# Patient Record
Sex: Female | Born: 1975 | Race: White | Hispanic: No | Marital: Married | Smoking: Current every day smoker
Health system: Southern US, Community
[De-identification: ages and names within clinical notes are randomized; demographics above are authoritative.]

## PROBLEM LIST (undated history)

## (undated) DIAGNOSIS — E119 Type 2 diabetes mellitus without complications: Secondary | ICD-10-CM

## (undated) DIAGNOSIS — E785 Hyperlipidemia, unspecified: Secondary | ICD-10-CM

## (undated) DIAGNOSIS — D649 Anemia, unspecified: Secondary | ICD-10-CM

## (undated) DIAGNOSIS — N189 Chronic kidney disease, unspecified: Secondary | ICD-10-CM

## (undated) DIAGNOSIS — F329 Major depressive disorder, single episode, unspecified: Secondary | ICD-10-CM

## (undated) DIAGNOSIS — F319 Bipolar disorder, unspecified: Secondary | ICD-10-CM

## (undated) DIAGNOSIS — F603 Borderline personality disorder: Secondary | ICD-10-CM

## (undated) DIAGNOSIS — J45909 Unspecified asthma, uncomplicated: Secondary | ICD-10-CM

## (undated) DIAGNOSIS — F4310 Post-traumatic stress disorder, unspecified: Secondary | ICD-10-CM

## (undated) DIAGNOSIS — F419 Anxiety disorder, unspecified: Secondary | ICD-10-CM

## (undated) DIAGNOSIS — E079 Disorder of thyroid, unspecified: Secondary | ICD-10-CM

## (undated) HISTORY — DX: Unspecified asthma, uncomplicated: J45.909

## (undated) HISTORY — DX: Chronic kidney disease, unspecified: N18.9

## (undated) HISTORY — DX: Disorder of thyroid, unspecified: E07.9

## (undated) HISTORY — PX: OTHER SURGICAL HISTORY: SHX169

## (undated) HISTORY — DX: Anemia, unspecified: D64.9

## (undated) HISTORY — DX: Hyperlipidemia, unspecified: E78.5

## (undated) HISTORY — PX: TUBAL LIGATION: SHX77

---

## 1999-02-27 ENCOUNTER — Encounter: Payer: Self-pay | Admitting: Obstetrics and Gynecology

## 1999-02-27 ENCOUNTER — Ambulatory Visit (HOSPITAL_COMMUNITY): Admission: RE | Admit: 1999-02-27 | Discharge: 1999-02-27 | Payer: Self-pay | Admitting: Obstetrics and Gynecology

## 1999-06-10 ENCOUNTER — Ambulatory Visit (HOSPITAL_COMMUNITY): Admission: RE | Admit: 1999-06-10 | Discharge: 1999-06-10 | Payer: Self-pay | Admitting: Obstetrics and Gynecology

## 1999-06-10 ENCOUNTER — Encounter: Payer: Self-pay | Admitting: Obstetrics and Gynecology

## 1999-07-13 ENCOUNTER — Inpatient Hospital Stay (HOSPITAL_COMMUNITY): Admission: AD | Admit: 1999-07-13 | Discharge: 1999-07-13 | Payer: Self-pay | Admitting: Obstetrics and Gynecology

## 1999-07-19 ENCOUNTER — Inpatient Hospital Stay (HOSPITAL_COMMUNITY): Admission: AD | Admit: 1999-07-19 | Discharge: 1999-07-19 | Payer: Self-pay | Admitting: Obstetrics and Gynecology

## 1999-07-21 ENCOUNTER — Inpatient Hospital Stay (HOSPITAL_COMMUNITY): Admission: AD | Admit: 1999-07-21 | Discharge: 1999-07-21 | Payer: Self-pay | Admitting: Obstetrics and Gynecology

## 1999-07-24 ENCOUNTER — Inpatient Hospital Stay (HOSPITAL_COMMUNITY): Admission: AD | Admit: 1999-07-24 | Discharge: 1999-07-24 | Payer: Self-pay | Admitting: Obstetrics and Gynecology

## 1999-07-25 ENCOUNTER — Inpatient Hospital Stay (HOSPITAL_COMMUNITY): Admission: AD | Admit: 1999-07-25 | Discharge: 1999-07-27 | Payer: Self-pay | Admitting: Obstetrics and Gynecology

## 1999-07-30 ENCOUNTER — Inpatient Hospital Stay (HOSPITAL_COMMUNITY): Admission: AD | Admit: 1999-07-30 | Discharge: 1999-07-31 | Payer: Self-pay | Admitting: Obstetrics and Gynecology

## 1999-07-30 ENCOUNTER — Encounter: Payer: Self-pay | Admitting: Obstetrics and Gynecology

## 1999-07-31 ENCOUNTER — Encounter: Payer: Self-pay | Admitting: Pulmonary Disease

## 1999-09-03 ENCOUNTER — Other Ambulatory Visit: Admission: RE | Admit: 1999-09-03 | Discharge: 1999-09-03 | Payer: Self-pay | Admitting: Obstetrics and Gynecology

## 1999-10-10 ENCOUNTER — Other Ambulatory Visit: Admission: RE | Admit: 1999-10-10 | Discharge: 1999-10-10 | Payer: Self-pay | Admitting: Obstetrics and Gynecology

## 2000-02-05 ENCOUNTER — Other Ambulatory Visit: Admission: RE | Admit: 2000-02-05 | Discharge: 2000-02-05 | Payer: Self-pay | Admitting: Obstetrics and Gynecology

## 2000-12-16 ENCOUNTER — Other Ambulatory Visit: Admission: RE | Admit: 2000-12-16 | Discharge: 2000-12-16 | Payer: Self-pay | Admitting: Obstetrics and Gynecology

## 2001-01-04 ENCOUNTER — Emergency Department (HOSPITAL_COMMUNITY): Admission: EM | Admit: 2001-01-04 | Discharge: 2001-01-04 | Payer: Self-pay | Admitting: Emergency Medicine

## 2001-09-18 ENCOUNTER — Emergency Department (HOSPITAL_COMMUNITY): Admission: EM | Admit: 2001-09-18 | Discharge: 2001-09-18 | Payer: Self-pay | Admitting: *Deleted

## 2001-09-27 ENCOUNTER — Emergency Department (HOSPITAL_COMMUNITY): Admission: EM | Admit: 2001-09-27 | Discharge: 2001-09-27 | Payer: Self-pay

## 2001-11-29 ENCOUNTER — Emergency Department (HOSPITAL_COMMUNITY): Admission: EM | Admit: 2001-11-29 | Discharge: 2001-11-29 | Payer: Self-pay | Admitting: Emergency Medicine

## 2002-03-15 ENCOUNTER — Emergency Department (HOSPITAL_COMMUNITY): Admission: EM | Admit: 2002-03-15 | Discharge: 2002-03-15 | Payer: Self-pay | Admitting: Emergency Medicine

## 2002-04-27 ENCOUNTER — Emergency Department (HOSPITAL_COMMUNITY): Admission: EM | Admit: 2002-04-27 | Discharge: 2002-04-28 | Payer: Self-pay | Admitting: Emergency Medicine

## 2004-02-12 ENCOUNTER — Ambulatory Visit: Payer: Self-pay | Admitting: Family Medicine

## 2004-02-23 ENCOUNTER — Ambulatory Visit: Payer: Self-pay | Admitting: *Deleted

## 2004-03-07 ENCOUNTER — Ambulatory Visit: Payer: Self-pay | Admitting: Family Medicine

## 2004-03-29 ENCOUNTER — Ambulatory Visit (HOSPITAL_COMMUNITY): Admission: RE | Admit: 2004-03-29 | Discharge: 2004-03-29 | Payer: Self-pay | Admitting: *Deleted

## 2004-04-04 ENCOUNTER — Ambulatory Visit: Payer: Self-pay | Admitting: Family Medicine

## 2004-04-05 ENCOUNTER — Ambulatory Visit: Payer: Self-pay | Admitting: Family Medicine

## 2004-04-08 ENCOUNTER — Encounter: Admission: RE | Admit: 2004-04-08 | Discharge: 2004-04-08 | Payer: Self-pay | Admitting: *Deleted

## 2004-04-18 ENCOUNTER — Ambulatory Visit: Payer: Self-pay | Admitting: Family Medicine

## 2004-04-25 ENCOUNTER — Ambulatory Visit: Payer: Self-pay | Admitting: Family Medicine

## 2004-05-09 ENCOUNTER — Ambulatory Visit: Payer: Self-pay | Admitting: Family Medicine

## 2004-05-23 ENCOUNTER — Ambulatory Visit: Payer: Self-pay | Admitting: Family Medicine

## 2004-06-04 ENCOUNTER — Ambulatory Visit (HOSPITAL_COMMUNITY): Admission: RE | Admit: 2004-06-04 | Discharge: 2004-06-04 | Payer: Self-pay | Admitting: *Deleted

## 2004-06-06 ENCOUNTER — Ambulatory Visit: Payer: Self-pay | Admitting: Family Medicine

## 2004-06-20 ENCOUNTER — Ambulatory Visit: Payer: Self-pay | Admitting: Family Medicine

## 2004-06-27 ENCOUNTER — Ambulatory Visit: Payer: Self-pay | Admitting: Family Medicine

## 2004-07-03 ENCOUNTER — Ambulatory Visit: Payer: Self-pay | Admitting: *Deleted

## 2004-07-10 ENCOUNTER — Ambulatory Visit: Payer: Self-pay | Admitting: *Deleted

## 2004-07-17 ENCOUNTER — Ambulatory Visit: Payer: Self-pay | Admitting: *Deleted

## 2004-07-24 ENCOUNTER — Ambulatory Visit: Payer: Self-pay | Admitting: *Deleted

## 2004-07-31 ENCOUNTER — Ambulatory Visit: Payer: Self-pay | Admitting: *Deleted

## 2004-08-02 ENCOUNTER — Ambulatory Visit (HOSPITAL_COMMUNITY): Admission: RE | Admit: 2004-08-02 | Discharge: 2004-08-02 | Payer: Self-pay | Admitting: *Deleted

## 2004-08-07 ENCOUNTER — Ambulatory Visit: Payer: Self-pay | Admitting: *Deleted

## 2004-08-14 ENCOUNTER — Ambulatory Visit: Payer: Self-pay | Admitting: *Deleted

## 2004-08-21 ENCOUNTER — Ambulatory Visit: Payer: Self-pay | Admitting: Obstetrics & Gynecology

## 2004-08-26 ENCOUNTER — Ambulatory Visit (HOSPITAL_COMMUNITY): Admission: RE | Admit: 2004-08-26 | Discharge: 2004-08-26 | Payer: Self-pay | Admitting: *Deleted

## 2004-08-26 ENCOUNTER — Ambulatory Visit: Payer: Self-pay | Admitting: Obstetrics & Gynecology

## 2004-08-28 ENCOUNTER — Ambulatory Visit: Payer: Self-pay | Admitting: *Deleted

## 2004-09-02 ENCOUNTER — Ambulatory Visit: Payer: Self-pay | Admitting: Obstetrics and Gynecology

## 2004-09-02 ENCOUNTER — Ambulatory Visit (HOSPITAL_COMMUNITY): Admission: RE | Admit: 2004-09-02 | Discharge: 2004-09-02 | Payer: Self-pay | Admitting: *Deleted

## 2004-09-04 ENCOUNTER — Ambulatory Visit: Payer: Self-pay | Admitting: Family Medicine

## 2004-09-09 ENCOUNTER — Ambulatory Visit: Payer: Self-pay | Admitting: Obstetrics and Gynecology

## 2004-09-09 ENCOUNTER — Ambulatory Visit (HOSPITAL_COMMUNITY): Admission: RE | Admit: 2004-09-09 | Discharge: 2004-09-09 | Payer: Self-pay | Admitting: *Deleted

## 2004-09-11 ENCOUNTER — Ambulatory Visit: Payer: Self-pay | Admitting: *Deleted

## 2004-09-16 ENCOUNTER — Inpatient Hospital Stay (HOSPITAL_COMMUNITY): Admission: AD | Admit: 2004-09-16 | Discharge: 2004-09-20 | Payer: Self-pay | Admitting: *Deleted

## 2004-09-16 ENCOUNTER — Ambulatory Visit: Payer: Self-pay | Admitting: Obstetrics & Gynecology

## 2004-09-16 ENCOUNTER — Ambulatory Visit: Payer: Self-pay | Admitting: Obstetrics and Gynecology

## 2004-09-16 ENCOUNTER — Ambulatory Visit: Payer: Self-pay | Admitting: Certified Nurse Midwife

## 2004-09-16 ENCOUNTER — Encounter: Payer: Self-pay | Admitting: *Deleted

## 2004-10-03 ENCOUNTER — Inpatient Hospital Stay (HOSPITAL_COMMUNITY): Admission: AD | Admit: 2004-10-03 | Discharge: 2004-10-04 | Payer: Self-pay | Admitting: Obstetrics & Gynecology

## 2004-11-18 ENCOUNTER — Ambulatory Visit: Payer: Self-pay | Admitting: Family Medicine

## 2005-01-13 ENCOUNTER — Ambulatory Visit: Payer: Self-pay | Admitting: Family Medicine

## 2005-09-19 IMAGING — US US OB FOLLOW-UP
1 series · 18 of 28 positions shown · non-contrast
Comparison: none

CLINICAL DATA: 30 week 2 day assigned gestational age.  Insulin dependent diabetes.  Evaluate fetal growth and biophysical profile.

[Series 1: us ob re-eval · 18 of 34 slices shown]
[im 1/34]
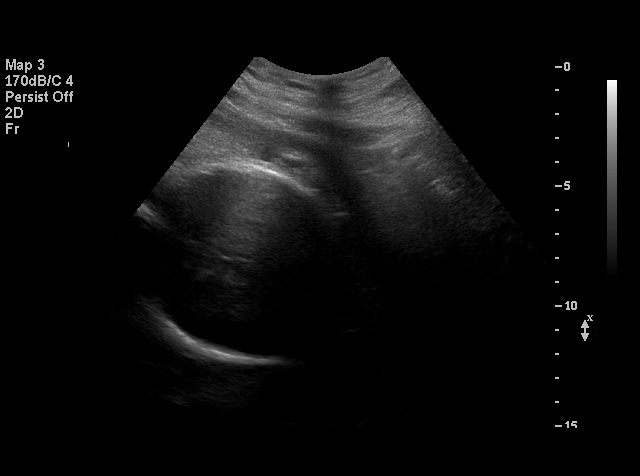
[im 3/34]
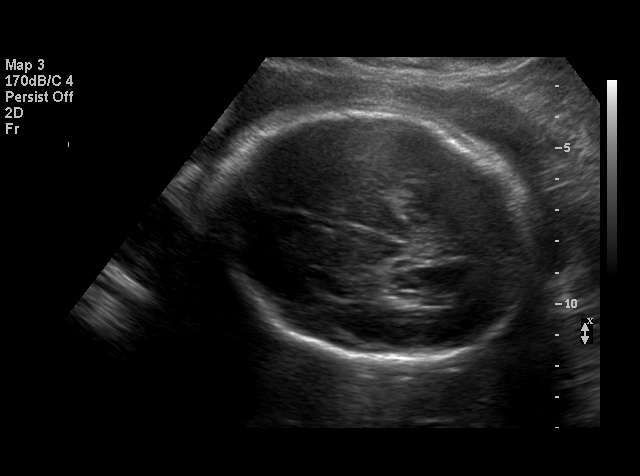
[im 4/34]
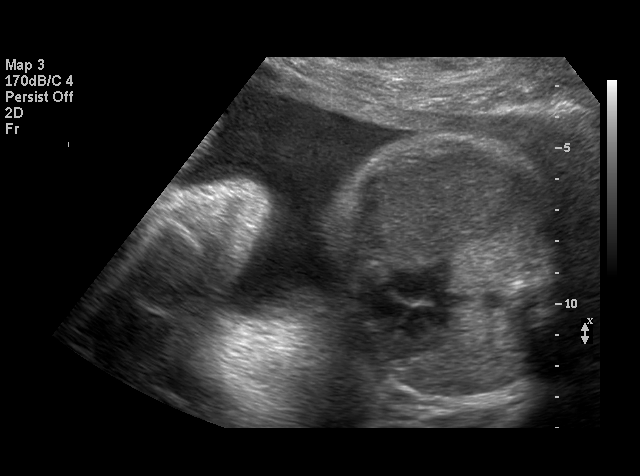
[im 7/34]
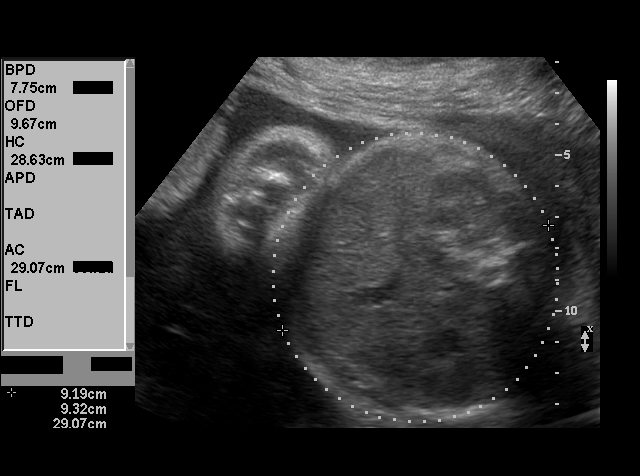
[im 9/34]
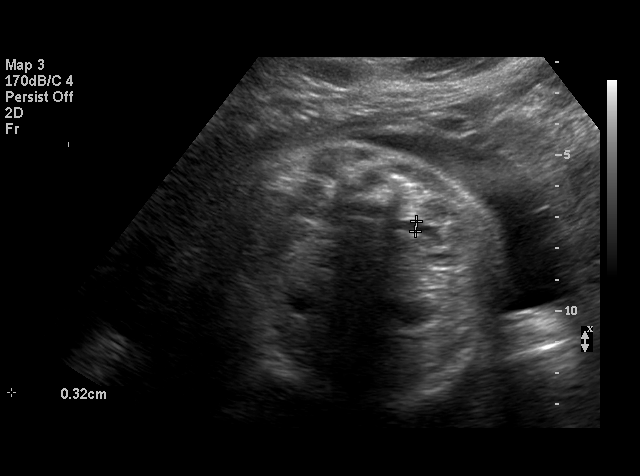
[im 10/34]
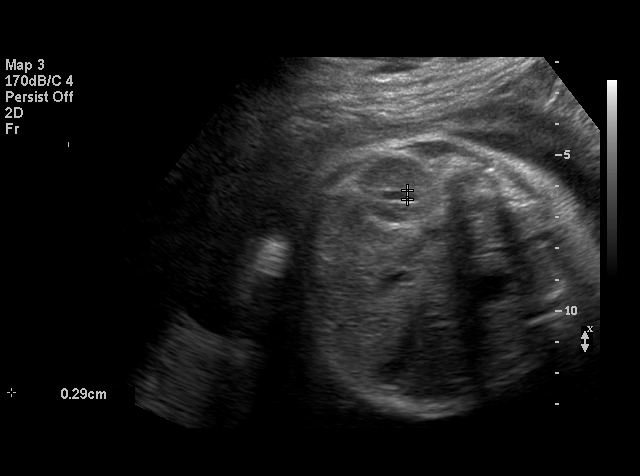
[im 13/34]
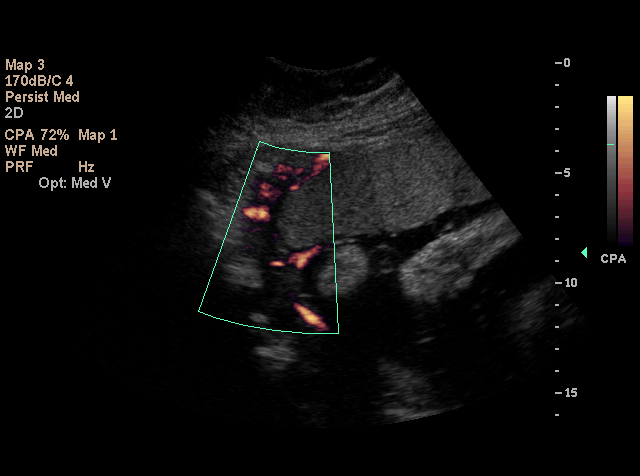
[im 14/34]
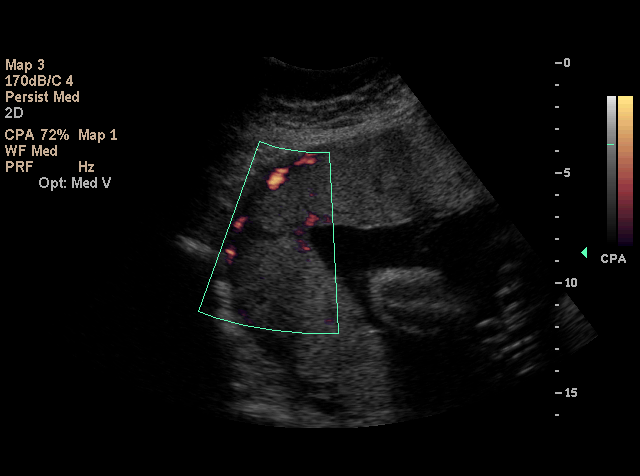
[im 16/34]
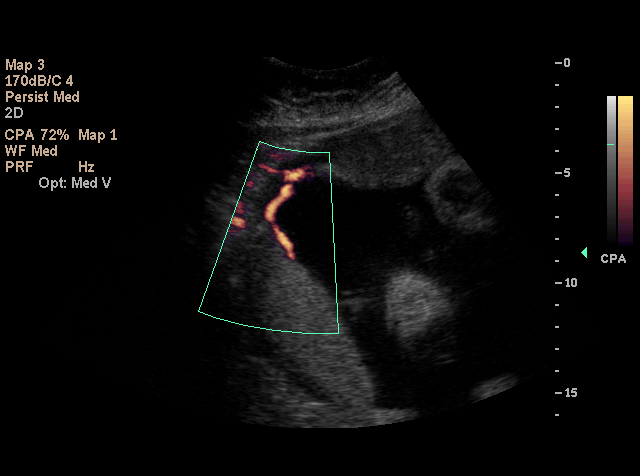
[im 18/34]
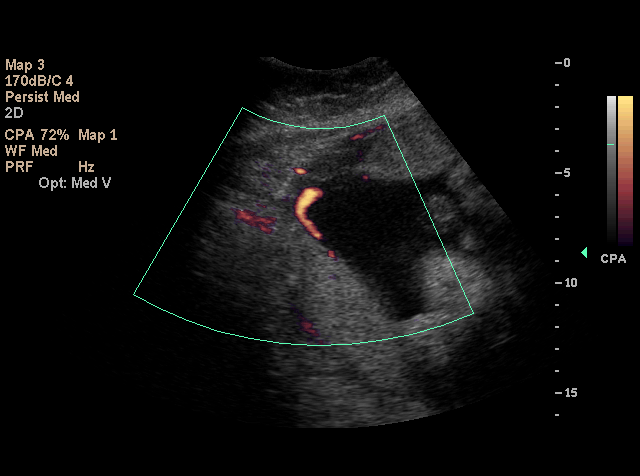
[im 20/34]
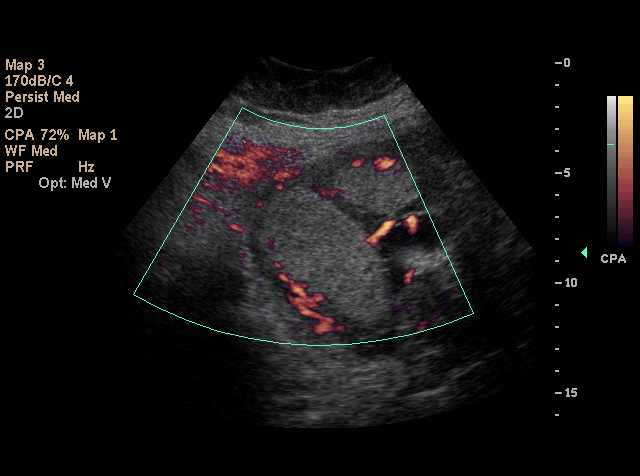
[im 21/34]
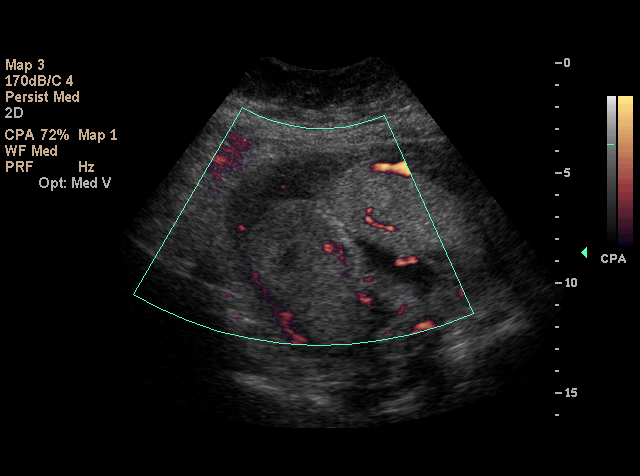
[im 24/34]
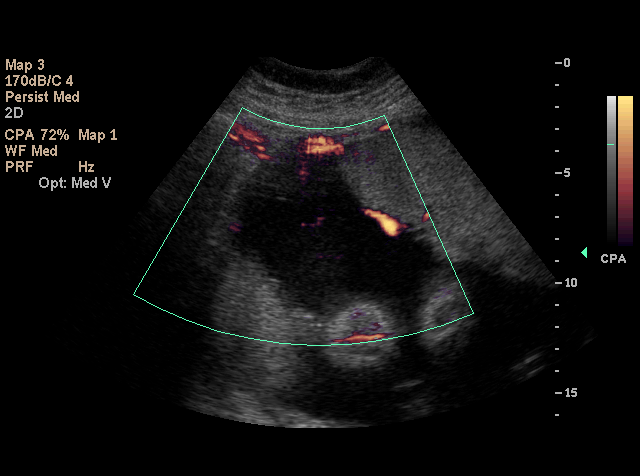
[im 26/34]
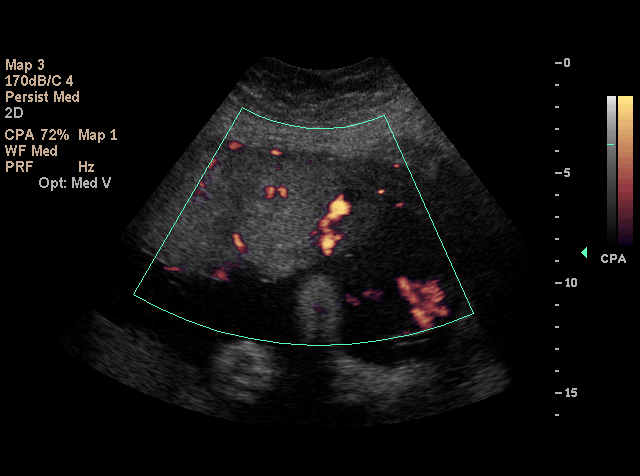
[im 27/34]
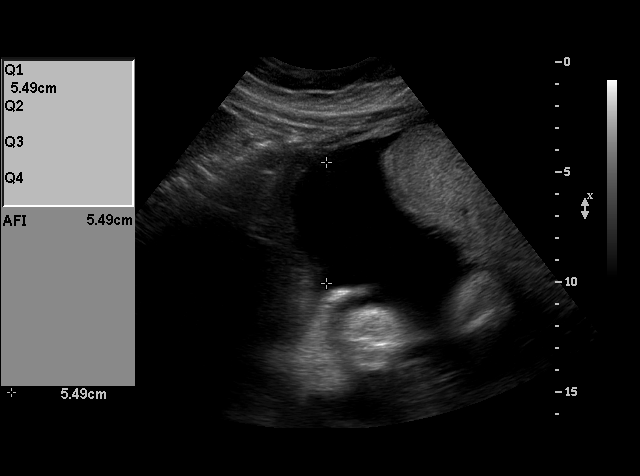
[im 30/34]
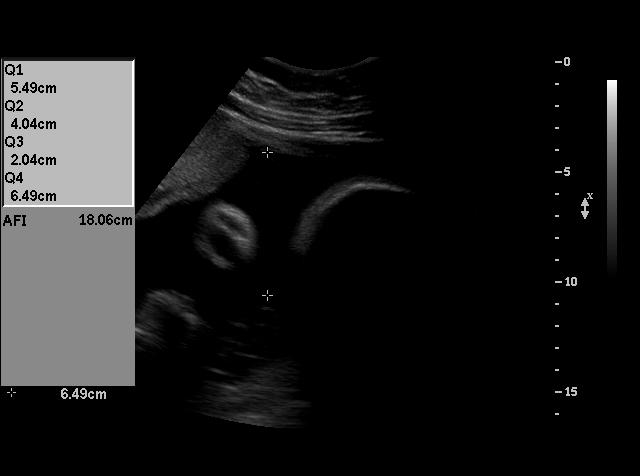
[im 31/34]
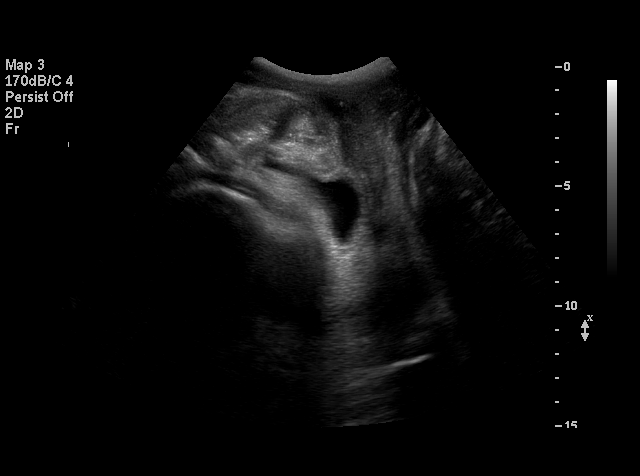
[im 34/34]
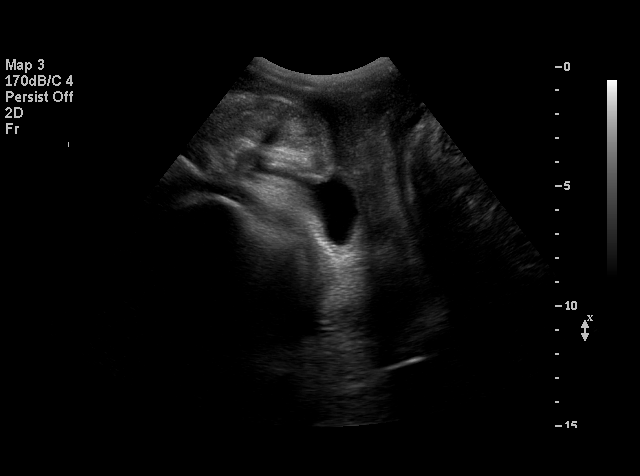

[18 of 28 positions shown; findings below may reference images not displayed]

OBSTETRICAL ULTRASOUND RE-EVALUATION:
 Number of Fetuses:  1
 Heart Rate:  158
 Movement:  Yes
 Breathing:    Yes
 Presentation:  Cephalic
 Placental Location:  Anterior, right lateral 
 Grade:  I
 Previa:  No
 Amniotic Fluid (subjective):  Normal
 Amniotic Fluid (objective):  18.1 cm AFI (5th -95th%ile = 9.0 – 23.4 cm for 30 wks)

 FETAL BIOMETRY
 BPD:  7.7 cm  31 w 1 d
 HC:  28.5 cm  31 w 3 d
 AC:  29.2 cm  33 w 2 d
 FL:  5.8 cm   30 w 2 d

 Mean GA:  31 w 4 d
 Assigned GA:  30 w 2 d

 EFW:  7835 g (H) Greater than 95th%ile (95th = 5646 g) For 30 wks

 FETAL ANATOMY
 Lateral Ventricles:  Visualized 
 Thalami/CSP:  Previously seen 
 Posterior Fossa:  Previously seen 
 Nuchal Region:  N/A
 Spine:  Previously seen 
 4 Chamber Heart on Left:  Visualized 
 Stomach on Left:  Visualized 
 3 Vessel Cord:  Previously seen 
 Cord Insertion Site:  Previously seen 
 Kidneys:  Visualized 
 Bladder:  Visualized 
 Extremities:  Previously seen 

 MATERNAL UTERINE AND ADNEXAL FINDINGS
 Cervix:  4.2 cm Translabially
IMPRESSION: 1.  Assigned gestational age is currently 30 weeks 2 days.  AC is 3 weeks ahead, and EFW greater than 95th percentile.  This is suspicious for fetal macrosomia.  
 2.  Normal amniotic fluid volume with AFI of 18.1 cm.  
 BIOPHYSICAL PROFILE

 Movement:  2    Time:  20 minutes
 Breathing:  2
 Tone:  2
 Amniotic Fluid:  2

 Total Score:  8
IMPRESSION: Biophysical profile score [DATE].

## 2005-10-13 IMAGING — US US FETAL BPP W/O NONSTRESS
1 series · 18 of 23 positions shown · non-contrast
Comparison: none

CLINICAL DATA: Insulin dependent diabetic.  Assess fetal well-being.

[Series 1: us fetal bpp w/o nonstress · non-contrast · 18 of 23 slices shown]
[im 1/23]
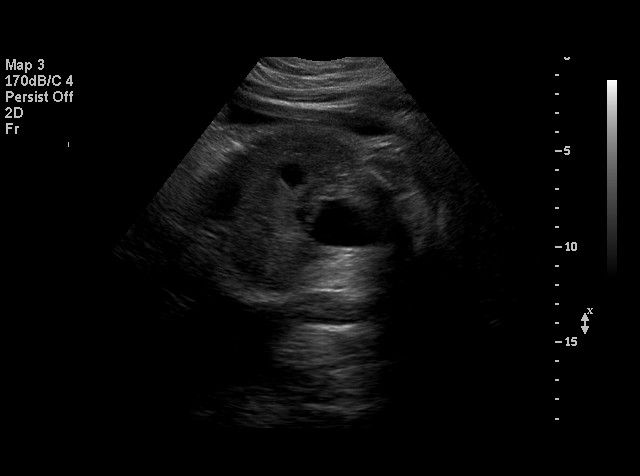
[im 2/23]
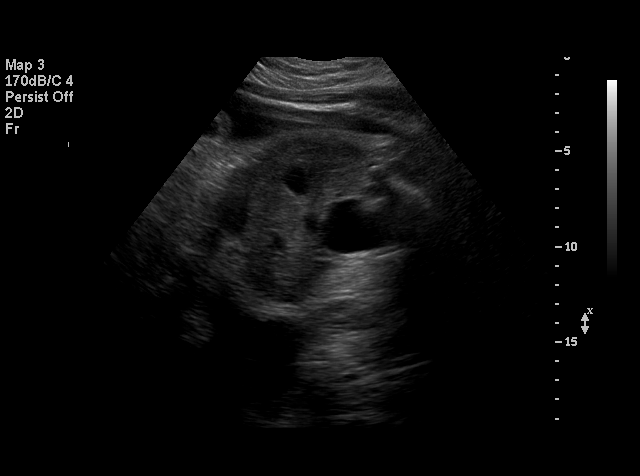
[im 4/23]
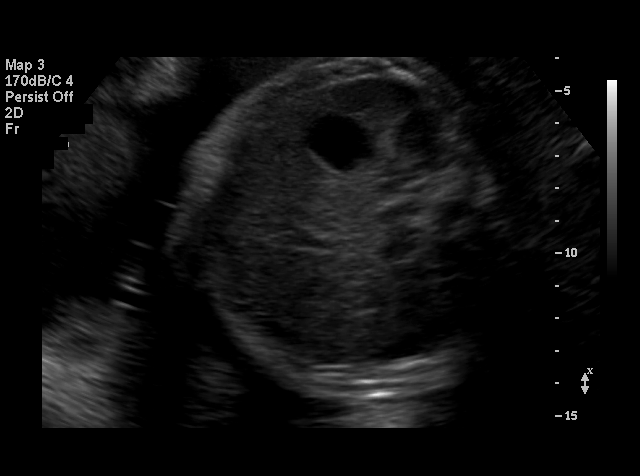
[im 5/23]
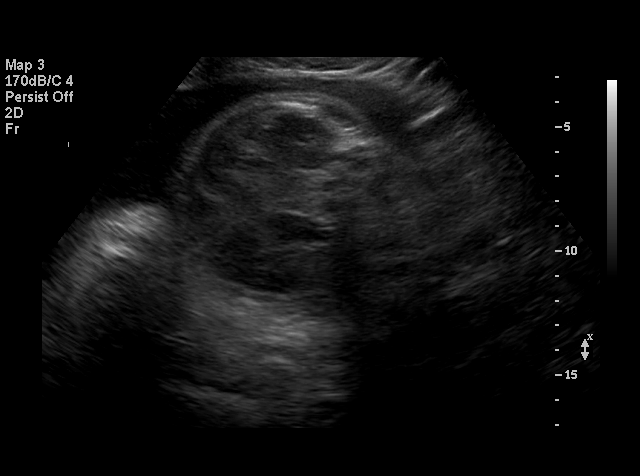
[im 6/23]
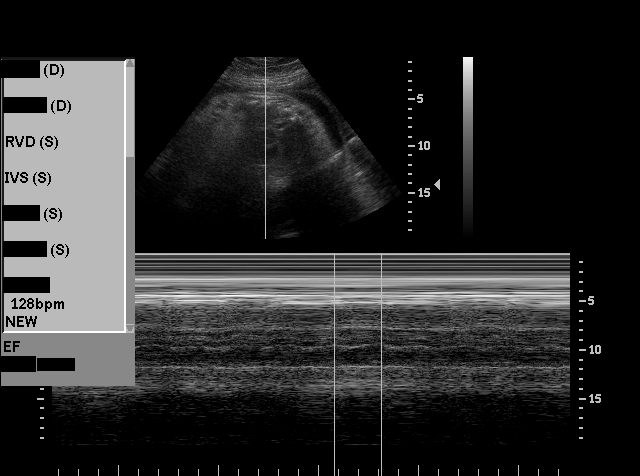
[im 8/23]
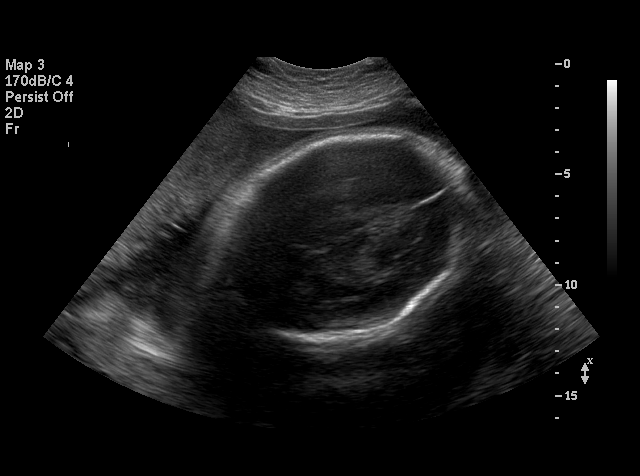
[im 9/23]
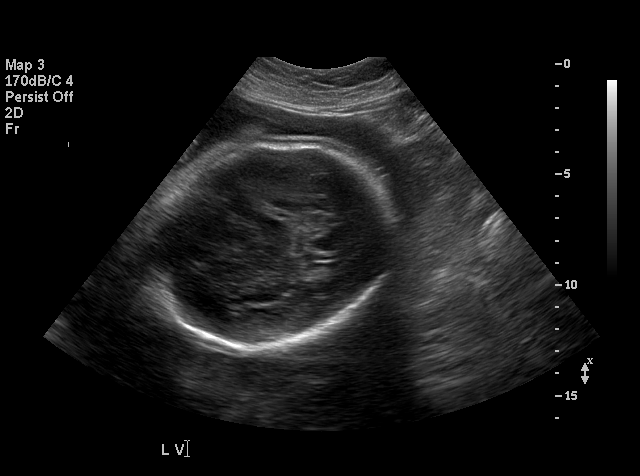
[im 10/23]
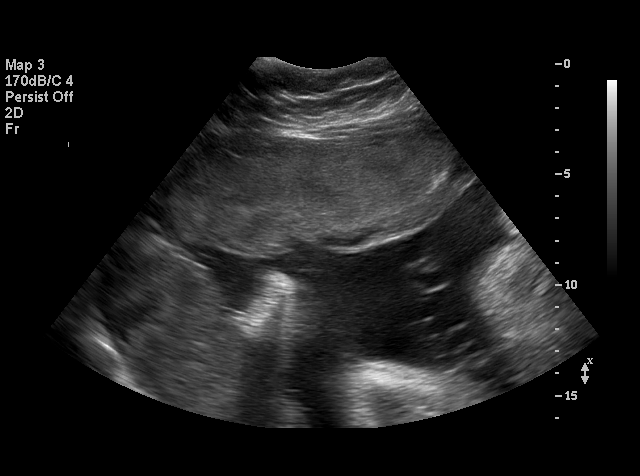
[im 11/23]
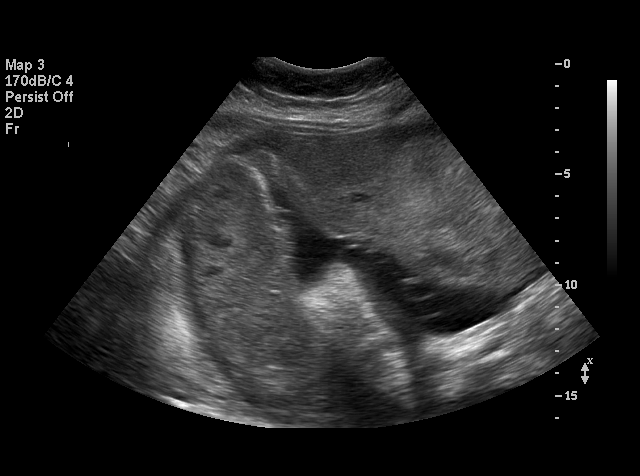
[im 13/23]
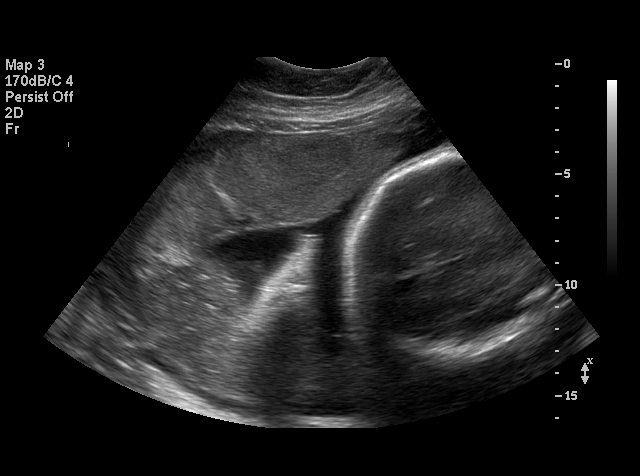
[im 14/23]
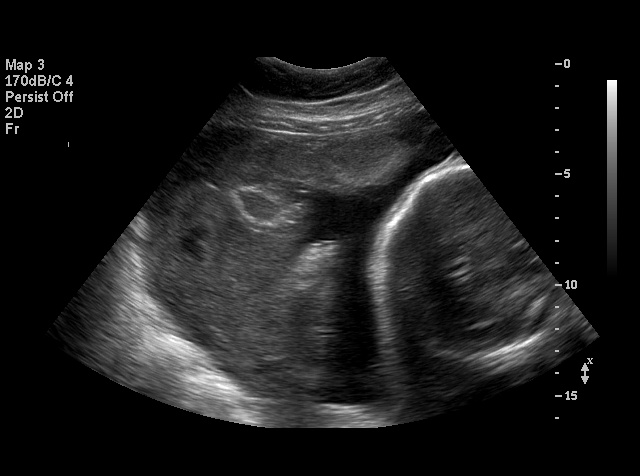
[im 15/23]
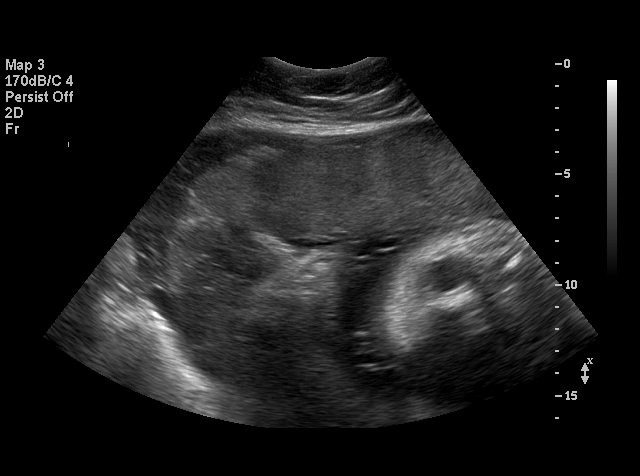
[im 16/23]
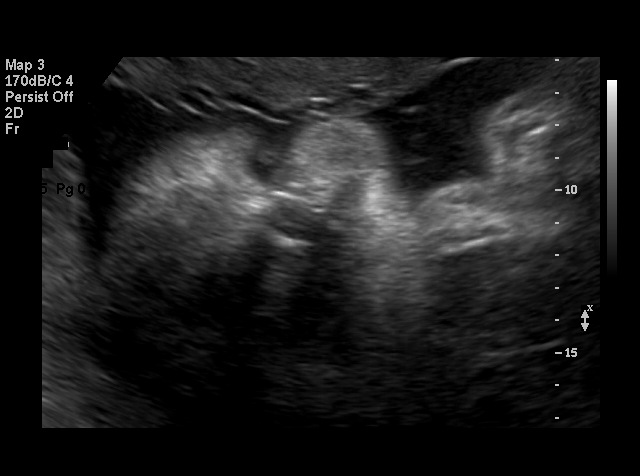
[im 18/23]
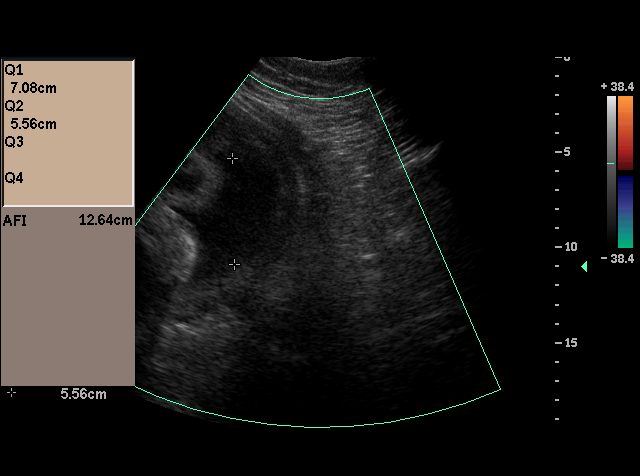
[im 19/23]
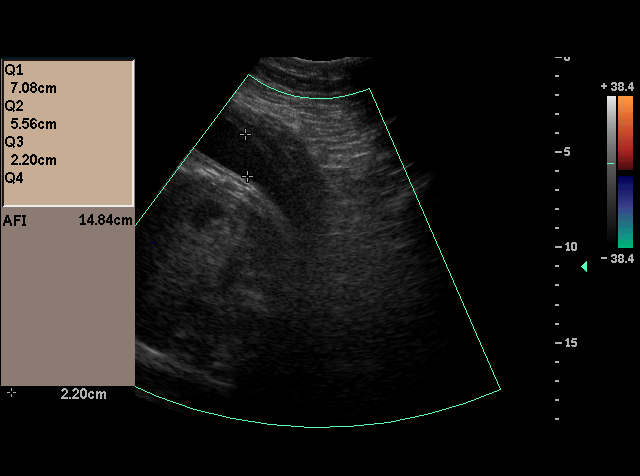
[im 20/23]
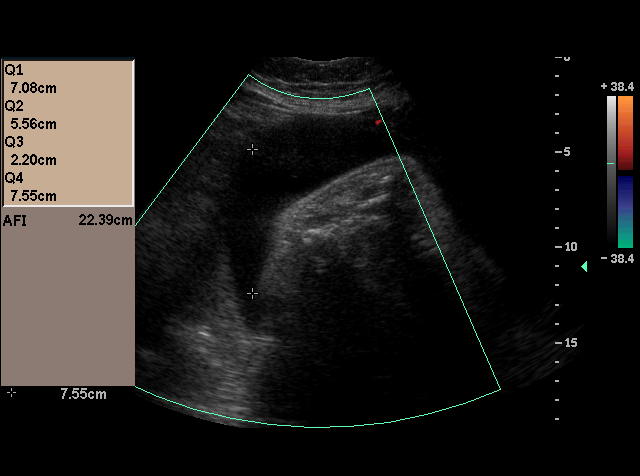
[im 22/23]
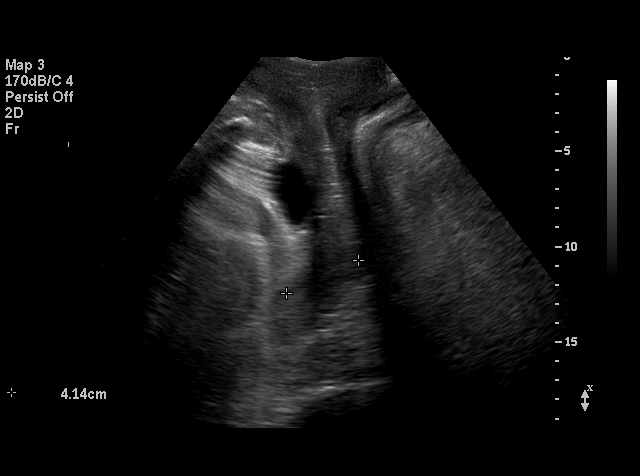
[im 23/23]
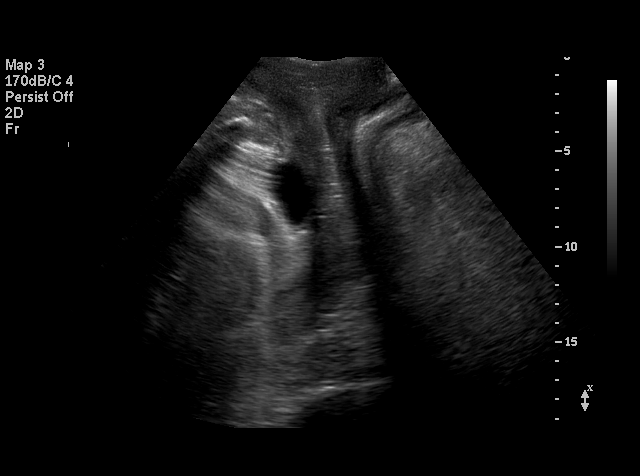

[18 of 23 positions shown; findings below may reference images not displayed]

BIOPHYSICAL PROFILE:

Number of Fetuses: 1
Heart rate: 128 bpm
Movement: yes
Breathing: yes
Presentation: variable
Placental Location: anterior, right lateral
Grade: 1
Previa: no
Amniotic Fluid (Subjective): normal
Amniotic Fluid (Objective): 22.4 cm AFI  (3th-13th %ile = 8.1 to 24.8 cm for 34 weeks) 

Fetal measurements and complete anatomic evaluation were not requested.  The following fetal anatomy was visualized on this exam: lateral ventricles, thalami, stomach, kidneys, bladder, and diaphragm.

BPP SCORING
Movements: 2  Time:  25 minutes
Breathing:  2
Tone:  2
Amniotic Fluid: 2
Total Score:  8

MATERNAL UTERINE AND ADNEXAL FINDINGS
Cervix:  4.1 cm translabially
IMPRESSION: 1.  Biophysical profile score [DATE].
2.  Subjectively and quantitatively normal amniotic fluid volume and normal cervical length.
3.  No late developing fetal anatomic abnormalities are seen associated with the lateral ventricles, stomach, kidneys, or bladder.  A four-chamber heart view could not be reassessed due to positioning on today?s exam.  The fifth digit and aortic arch have not been seen on prior studies and could not be seen today due to positioning.

## 2005-10-27 IMAGING — US US FETAL BPP W/O NONSTRESS
1 series · 13 of 28 positions shown · non-contrast
Comparison: none

CLINICAL DATA: Type II diabetes.  Assess fetal well-being.

[Series 1: us fetal bpp w/o nonstress · 0.33mm/px · 13 of 33 slices shown]
[im 2/33]
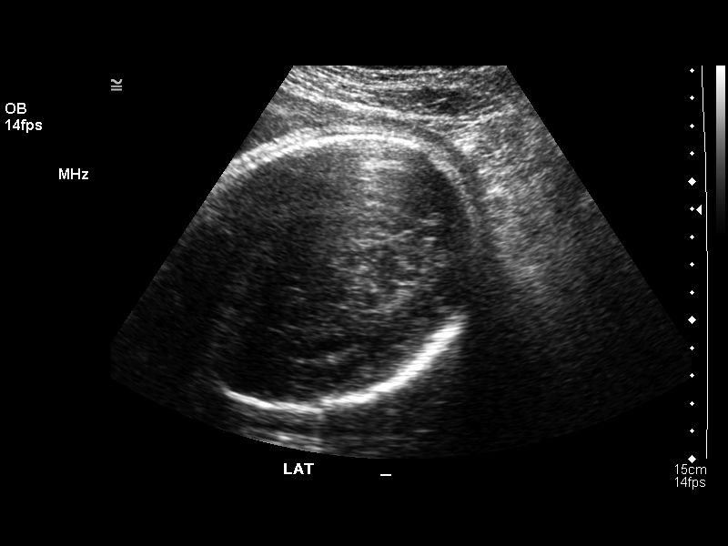
[im 4/33]
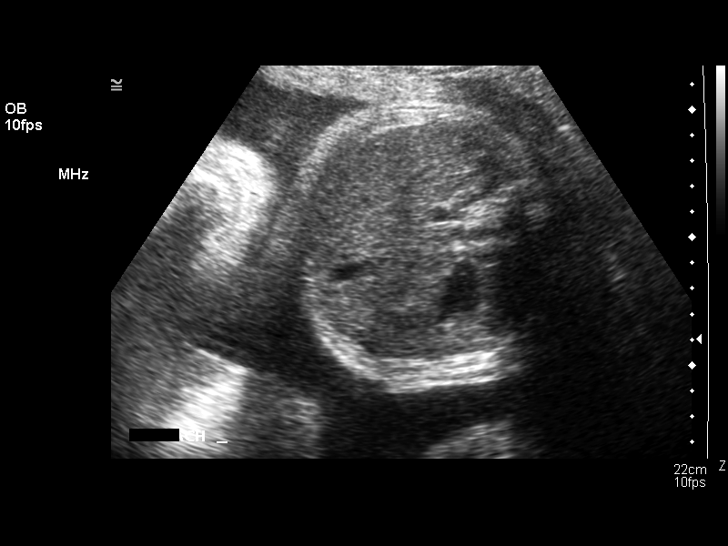
[im 6/33]
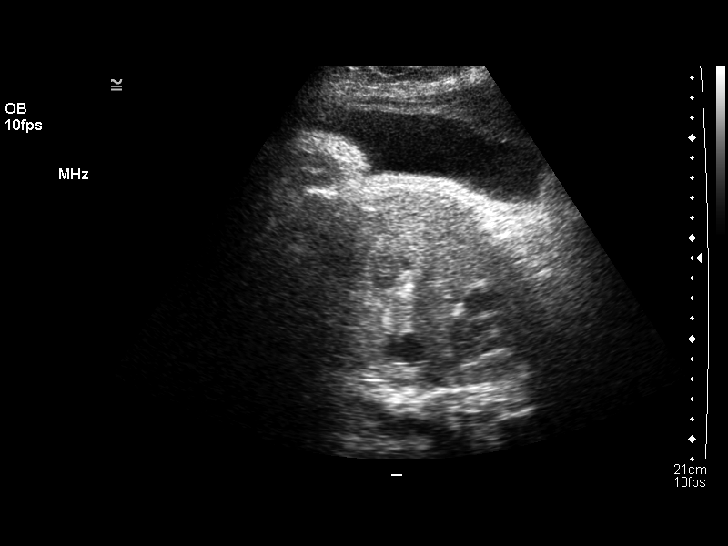
[im 9/33]
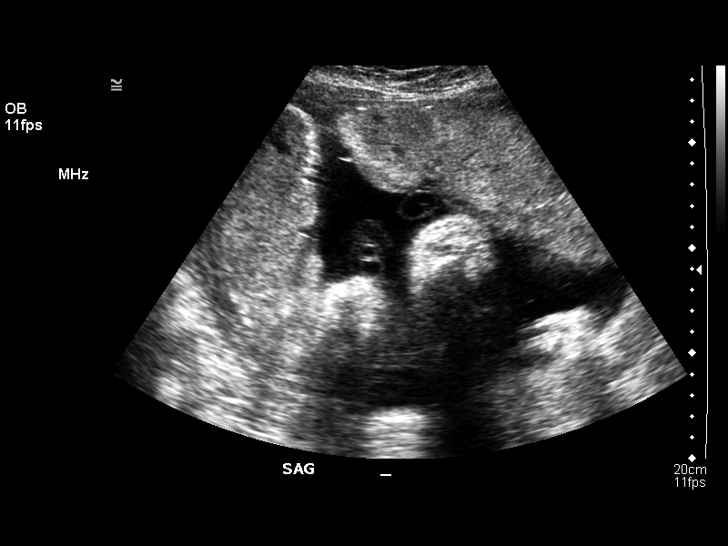
[im 11/33]
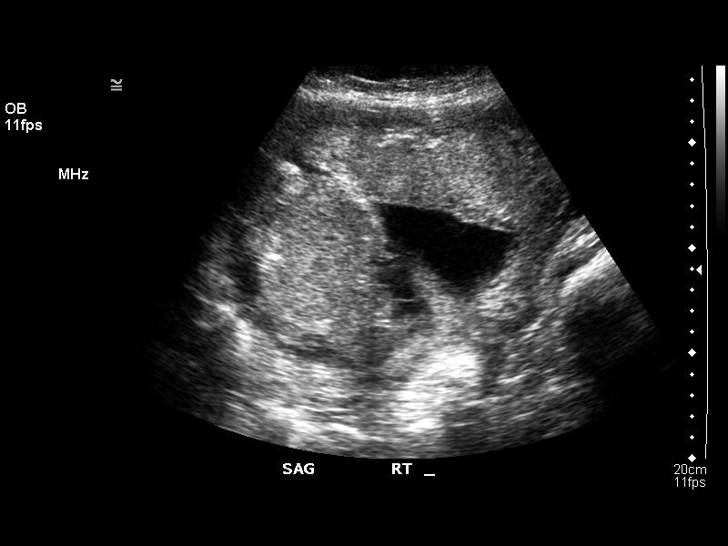
[im 14/33]
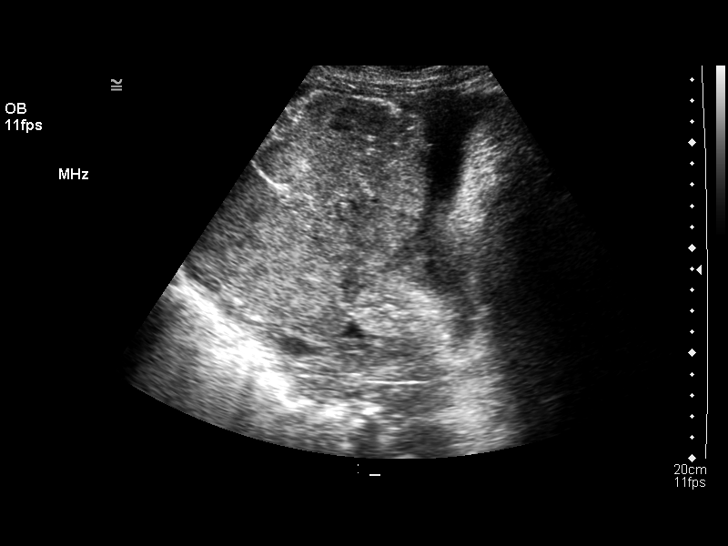
[im 17/33]
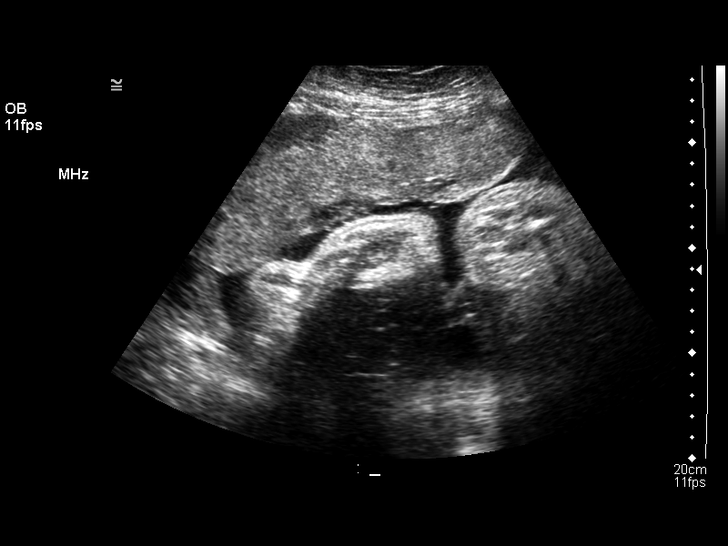
[im 19/33]
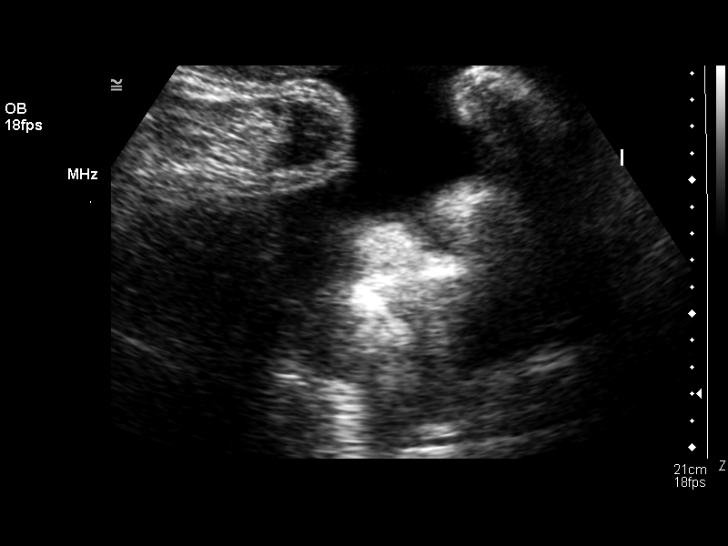
[im 22/33]
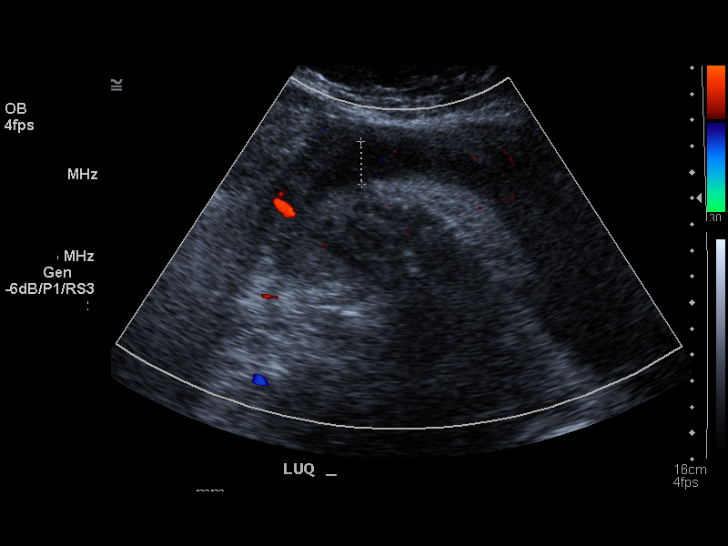
[im 24/33]
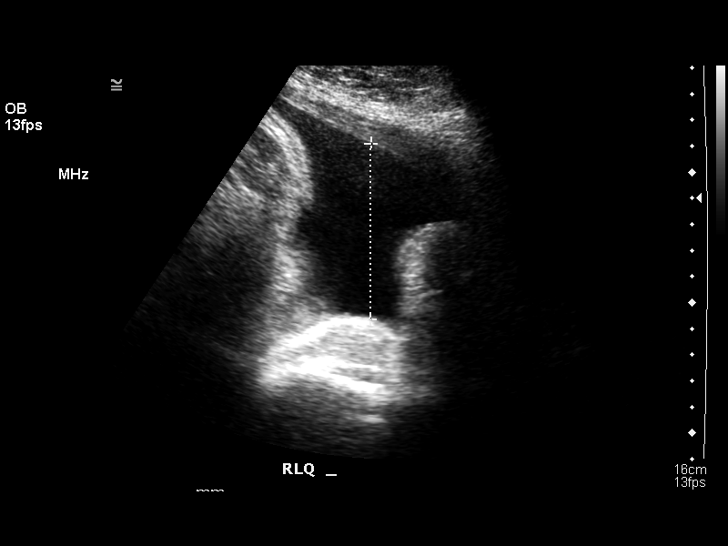
[im 27/33]
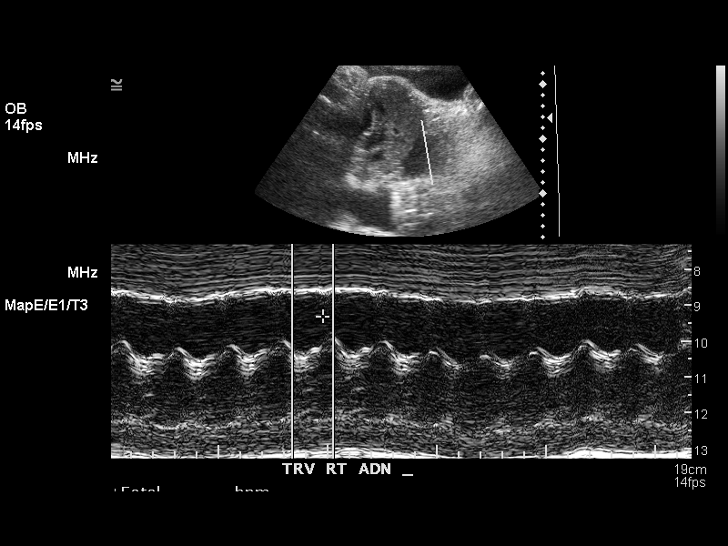
[im 29/33]
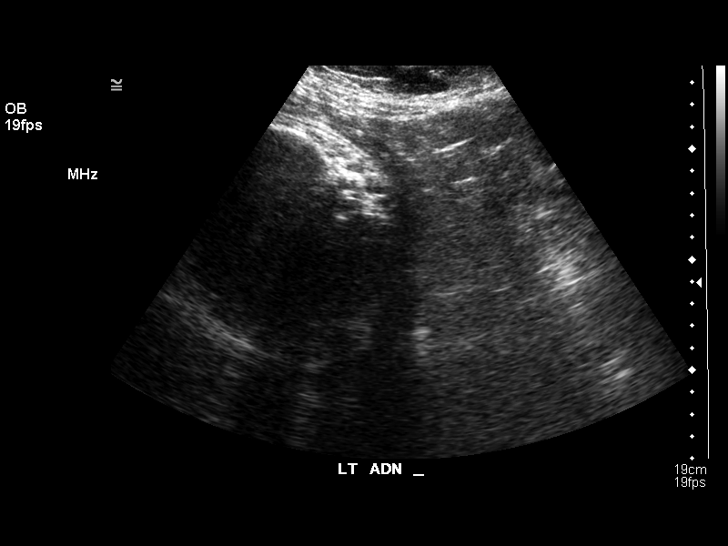
[im 31/33]
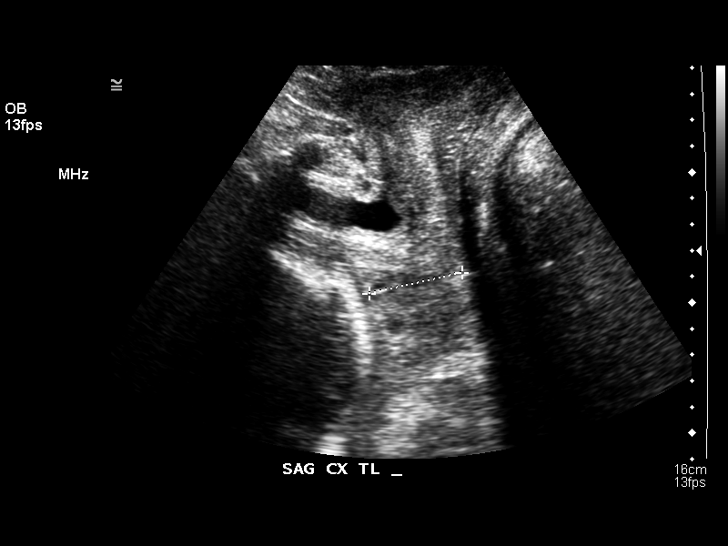

[13 of 28 positions shown; findings below may reference images not displayed]

BIOPHYSICAL PROFILE

Number of Fetuses:  1
Heart rate:  139
Movement:  Yes
Breathing:  Yes
Presentation:  Cephalic
Placental Location:  Anterior, right lateral 
Grade:  II
Previa:  No
Amniotic Fluid (Subjective):  Normal
Amniotic Fluid (Objective):  19.5 cm AFI (5th -95th%ile = 7.7 ? 24.9 cm for 36 wks)

Fetal measurements and complete anatomic evaluation were not requested.  The following fetal anatomy was visualized on this exam:  Lateral ventricles, stomach, kidneys and bladder.

BPP SCORING
Movements:  2  Time:  30 minutes
Breathing:  0
Tone:  2
Amniotic Fluid:  2
Total Score:  8

MATERNAL UTERINE AND ADNEXAL FINDINGS
Cervix:  3.7 cm Translabially
IMPRESSION: 1.  Biophysical profile score [DATE].  The fetus scored zero for lack of sustained fetal breathing for 20 seconds.  
2.  Subjectively and quantitatively normal amniotic fluid volume and normal cervical length.  
3.  No late developing fetal anatomic abnormalities are identified associated with the lateral ventricles, stomach, kidneys or bladder.  A four chamber heart view could not be reassessed due to positioning on today?s exam.
4.  The patient was sent with a copy of this report to a clinic visit immediately following this exam.

## 2008-02-11 ENCOUNTER — Encounter (INDEPENDENT_AMBULATORY_CARE_PROVIDER_SITE_OTHER): Payer: Self-pay | Admitting: Family Medicine

## 2010-06-09 ENCOUNTER — Encounter: Payer: Self-pay | Admitting: *Deleted

## 2014-05-23 DIAGNOSIS — F3181 Bipolar II disorder: Secondary | ICD-10-CM | POA: Diagnosis not present

## 2014-06-06 DIAGNOSIS — F3181 Bipolar II disorder: Secondary | ICD-10-CM | POA: Diagnosis not present

## 2014-06-13 DIAGNOSIS — F3181 Bipolar II disorder: Secondary | ICD-10-CM | POA: Diagnosis not present

## 2014-06-20 DIAGNOSIS — F3181 Bipolar II disorder: Secondary | ICD-10-CM | POA: Diagnosis not present

## 2014-07-10 DIAGNOSIS — N189 Chronic kidney disease, unspecified: Secondary | ICD-10-CM | POA: Diagnosis not present

## 2014-07-10 DIAGNOSIS — Z79899 Other long term (current) drug therapy: Secondary | ICD-10-CM | POA: Diagnosis not present

## 2014-07-10 DIAGNOSIS — R079 Chest pain, unspecified: Secondary | ICD-10-CM | POA: Diagnosis not present

## 2014-07-10 DIAGNOSIS — F17210 Nicotine dependence, cigarettes, uncomplicated: Secondary | ICD-10-CM | POA: Diagnosis not present

## 2014-07-10 DIAGNOSIS — J189 Pneumonia, unspecified organism: Secondary | ICD-10-CM | POA: Diagnosis not present

## 2014-07-10 DIAGNOSIS — R5383 Other fatigue: Secondary | ICD-10-CM | POA: Diagnosis not present

## 2014-07-10 DIAGNOSIS — R739 Hyperglycemia, unspecified: Secondary | ICD-10-CM | POA: Diagnosis not present

## 2014-07-10 DIAGNOSIS — R0602 Shortness of breath: Secondary | ICD-10-CM | POA: Diagnosis not present

## 2014-07-10 DIAGNOSIS — E1165 Type 2 diabetes mellitus with hyperglycemia: Secondary | ICD-10-CM | POA: Diagnosis not present

## 2014-07-10 DIAGNOSIS — L731 Pseudofolliculitis barbae: Secondary | ICD-10-CM | POA: Diagnosis not present

## 2014-07-10 DIAGNOSIS — R05 Cough: Secondary | ICD-10-CM | POA: Diagnosis not present

## 2014-07-13 DIAGNOSIS — E1165 Type 2 diabetes mellitus with hyperglycemia: Secondary | ICD-10-CM | POA: Diagnosis not present

## 2014-07-13 DIAGNOSIS — E039 Hypothyroidism, unspecified: Secondary | ICD-10-CM | POA: Diagnosis not present

## 2014-07-13 DIAGNOSIS — J189 Pneumonia, unspecified organism: Secondary | ICD-10-CM | POA: Diagnosis not present

## 2014-07-13 DIAGNOSIS — F319 Bipolar disorder, unspecified: Secondary | ICD-10-CM | POA: Diagnosis not present

## 2014-07-13 DIAGNOSIS — Z6835 Body mass index (BMI) 35.0-35.9, adult: Secondary | ICD-10-CM | POA: Diagnosis not present

## 2014-07-18 DIAGNOSIS — J189 Pneumonia, unspecified organism: Secondary | ICD-10-CM | POA: Diagnosis not present

## 2014-07-18 DIAGNOSIS — E119 Type 2 diabetes mellitus without complications: Secondary | ICD-10-CM | POA: Diagnosis not present

## 2014-07-25 DIAGNOSIS — E039 Hypothyroidism, unspecified: Secondary | ICD-10-CM | POA: Diagnosis not present

## 2014-07-25 DIAGNOSIS — Z590 Homelessness: Secondary | ICD-10-CM | POA: Diagnosis not present

## 2014-07-25 DIAGNOSIS — F329 Major depressive disorder, single episode, unspecified: Secondary | ICD-10-CM | POA: Diagnosis not present

## 2014-07-25 DIAGNOSIS — L739 Follicular disorder, unspecified: Secondary | ICD-10-CM | POA: Diagnosis not present

## 2014-07-25 DIAGNOSIS — F419 Anxiety disorder, unspecified: Secondary | ICD-10-CM | POA: Diagnosis not present

## 2014-07-25 DIAGNOSIS — Z720 Tobacco use: Secondary | ICD-10-CM | POA: Diagnosis not present

## 2014-07-25 DIAGNOSIS — E119 Type 2 diabetes mellitus without complications: Secondary | ICD-10-CM | POA: Diagnosis not present

## 2014-07-25 DIAGNOSIS — D649 Anemia, unspecified: Secondary | ICD-10-CM | POA: Diagnosis not present

## 2014-07-25 DIAGNOSIS — N920 Excessive and frequent menstruation with regular cycle: Secondary | ICD-10-CM | POA: Diagnosis not present

## 2014-08-01 DIAGNOSIS — K219 Gastro-esophageal reflux disease without esophagitis: Secondary | ICD-10-CM | POA: Diagnosis not present

## 2014-08-01 DIAGNOSIS — R609 Edema, unspecified: Secondary | ICD-10-CM | POA: Diagnosis not present

## 2014-08-01 DIAGNOSIS — E039 Hypothyroidism, unspecified: Secondary | ICD-10-CM | POA: Diagnosis not present

## 2014-08-01 DIAGNOSIS — E782 Mixed hyperlipidemia: Secondary | ICD-10-CM | POA: Diagnosis not present

## 2014-08-15 DIAGNOSIS — Z720 Tobacco use: Secondary | ICD-10-CM | POA: Diagnosis not present

## 2014-08-15 DIAGNOSIS — Z590 Homelessness: Secondary | ICD-10-CM | POA: Diagnosis not present

## 2014-08-15 DIAGNOSIS — E119 Type 2 diabetes mellitus without complications: Secondary | ICD-10-CM | POA: Diagnosis not present

## 2014-08-15 DIAGNOSIS — G2581 Restless legs syndrome: Secondary | ICD-10-CM | POA: Diagnosis not present

## 2014-08-15 DIAGNOSIS — J40 Bronchitis, not specified as acute or chronic: Secondary | ICD-10-CM | POA: Diagnosis not present

## 2014-08-15 DIAGNOSIS — N760 Acute vaginitis: Secondary | ICD-10-CM | POA: Diagnosis not present

## 2014-08-18 DIAGNOSIS — Z8701 Personal history of pneumonia (recurrent): Secondary | ICD-10-CM | POA: Diagnosis not present

## 2014-08-18 DIAGNOSIS — J40 Bronchitis, not specified as acute or chronic: Secondary | ICD-10-CM | POA: Diagnosis not present

## 2014-08-22 DIAGNOSIS — F4010 Social phobia, unspecified: Secondary | ICD-10-CM | POA: Diagnosis not present

## 2014-08-22 DIAGNOSIS — F17200 Nicotine dependence, unspecified, uncomplicated: Secondary | ICD-10-CM | POA: Diagnosis not present

## 2014-08-22 DIAGNOSIS — F322 Major depressive disorder, single episode, severe without psychotic features: Secondary | ICD-10-CM | POA: Diagnosis not present

## 2014-08-22 DIAGNOSIS — F603 Borderline personality disorder: Secondary | ICD-10-CM | POA: Diagnosis not present

## 2014-08-23 DIAGNOSIS — Z720 Tobacco use: Secondary | ICD-10-CM | POA: Diagnosis not present

## 2014-08-23 DIAGNOSIS — E119 Type 2 diabetes mellitus without complications: Secondary | ICD-10-CM | POA: Diagnosis not present

## 2014-08-23 DIAGNOSIS — Z590 Homelessness: Secondary | ICD-10-CM | POA: Diagnosis not present

## 2014-08-23 DIAGNOSIS — R609 Edema, unspecified: Secondary | ICD-10-CM | POA: Diagnosis not present

## 2014-08-30 DIAGNOSIS — F603 Borderline personality disorder: Secondary | ICD-10-CM | POA: Diagnosis not present

## 2014-08-30 DIAGNOSIS — F17200 Nicotine dependence, unspecified, uncomplicated: Secondary | ICD-10-CM | POA: Diagnosis not present

## 2014-08-30 DIAGNOSIS — F322 Major depressive disorder, single episode, severe without psychotic features: Secondary | ICD-10-CM | POA: Diagnosis not present

## 2014-08-30 DIAGNOSIS — F4010 Social phobia, unspecified: Secondary | ICD-10-CM | POA: Diagnosis not present

## 2014-08-31 DIAGNOSIS — N920 Excessive and frequent menstruation with regular cycle: Secondary | ICD-10-CM | POA: Diagnosis not present

## 2014-09-12 DIAGNOSIS — Z720 Tobacco use: Secondary | ICD-10-CM | POA: Diagnosis not present

## 2014-09-12 DIAGNOSIS — N920 Excessive and frequent menstruation with regular cycle: Secondary | ICD-10-CM | POA: Diagnosis not present

## 2014-09-12 DIAGNOSIS — N8329 Other ovarian cysts: Secondary | ICD-10-CM | POA: Diagnosis not present

## 2014-09-12 DIAGNOSIS — J45909 Unspecified asthma, uncomplicated: Secondary | ICD-10-CM | POA: Diagnosis not present

## 2014-09-12 DIAGNOSIS — E119 Type 2 diabetes mellitus without complications: Secondary | ICD-10-CM | POA: Diagnosis not present

## 2014-09-12 DIAGNOSIS — N939 Abnormal uterine and vaginal bleeding, unspecified: Secondary | ICD-10-CM | POA: Diagnosis not present

## 2014-09-12 DIAGNOSIS — N8320 Unspecified ovarian cysts: Secondary | ICD-10-CM | POA: Diagnosis not present

## 2014-09-12 DIAGNOSIS — R062 Wheezing: Secondary | ICD-10-CM | POA: Diagnosis not present

## 2014-09-12 DIAGNOSIS — N840 Polyp of corpus uteri: Secondary | ICD-10-CM | POA: Diagnosis not present

## 2014-09-26 DIAGNOSIS — G2581 Restless legs syndrome: Secondary | ICD-10-CM | POA: Diagnosis not present

## 2014-09-26 DIAGNOSIS — R609 Edema, unspecified: Secondary | ICD-10-CM | POA: Diagnosis not present

## 2014-09-26 DIAGNOSIS — D649 Anemia, unspecified: Secondary | ICD-10-CM | POA: Diagnosis not present

## 2014-12-22 DIAGNOSIS — F3181 Bipolar II disorder: Secondary | ICD-10-CM | POA: Diagnosis not present

## 2014-12-25 DIAGNOSIS — Z6837 Body mass index (BMI) 37.0-37.9, adult: Secondary | ICD-10-CM | POA: Diagnosis not present

## 2014-12-25 DIAGNOSIS — B372 Candidiasis of skin and nail: Secondary | ICD-10-CM | POA: Diagnosis not present

## 2014-12-25 DIAGNOSIS — N920 Excessive and frequent menstruation with regular cycle: Secondary | ICD-10-CM | POA: Diagnosis not present

## 2014-12-25 DIAGNOSIS — E1165 Type 2 diabetes mellitus with hyperglycemia: Secondary | ICD-10-CM | POA: Diagnosis not present

## 2014-12-25 DIAGNOSIS — R600 Localized edema: Secondary | ICD-10-CM | POA: Diagnosis not present

## 2014-12-26 DIAGNOSIS — F3181 Bipolar II disorder: Secondary | ICD-10-CM | POA: Diagnosis not present

## 2015-01-05 DIAGNOSIS — R531 Weakness: Secondary | ICD-10-CM | POA: Diagnosis not present

## 2015-01-05 DIAGNOSIS — E876 Hypokalemia: Secondary | ICD-10-CM | POA: Diagnosis not present

## 2015-01-05 DIAGNOSIS — Z79899 Other long term (current) drug therapy: Secondary | ICD-10-CM | POA: Diagnosis not present

## 2015-01-05 DIAGNOSIS — J40 Bronchitis, not specified as acute or chronic: Secondary | ICD-10-CM | POA: Diagnosis not present

## 2015-01-05 DIAGNOSIS — R05 Cough: Secondary | ICD-10-CM | POA: Diagnosis not present

## 2015-01-05 DIAGNOSIS — E1165 Type 2 diabetes mellitus with hyperglycemia: Secondary | ICD-10-CM | POA: Diagnosis not present

## 2015-01-05 DIAGNOSIS — Z590 Homelessness: Secondary | ICD-10-CM | POA: Diagnosis not present

## 2015-01-05 DIAGNOSIS — R0602 Shortness of breath: Secondary | ICD-10-CM | POA: Diagnosis not present

## 2015-01-10 DIAGNOSIS — N920 Excessive and frequent menstruation with regular cycle: Secondary | ICD-10-CM | POA: Diagnosis not present

## 2015-02-06 DIAGNOSIS — N920 Excessive and frequent menstruation with regular cycle: Secondary | ICD-10-CM | POA: Diagnosis not present

## 2015-02-08 DIAGNOSIS — E1165 Type 2 diabetes mellitus with hyperglycemia: Secondary | ICD-10-CM | POA: Diagnosis not present

## 2015-02-08 DIAGNOSIS — Z6833 Body mass index (BMI) 33.0-33.9, adult: Secondary | ICD-10-CM | POA: Diagnosis not present

## 2015-02-08 DIAGNOSIS — Z01818 Encounter for other preprocedural examination: Secondary | ICD-10-CM | POA: Diagnosis not present

## 2015-02-08 DIAGNOSIS — D539 Nutritional anemia, unspecified: Secondary | ICD-10-CM | POA: Diagnosis not present

## 2015-02-20 DIAGNOSIS — F3181 Bipolar II disorder: Secondary | ICD-10-CM | POA: Diagnosis not present

## 2015-03-01 DIAGNOSIS — N920 Excessive and frequent menstruation with regular cycle: Secondary | ICD-10-CM | POA: Diagnosis not present

## 2015-03-01 DIAGNOSIS — R9431 Abnormal electrocardiogram [ECG] [EKG]: Secondary | ICD-10-CM | POA: Diagnosis not present

## 2015-03-01 DIAGNOSIS — I10 Essential (primary) hypertension: Secondary | ICD-10-CM | POA: Diagnosis not present

## 2015-03-02 ENCOUNTER — Other Ambulatory Visit: Payer: Self-pay

## 2015-03-02 DIAGNOSIS — N920 Excessive and frequent menstruation with regular cycle: Secondary | ICD-10-CM | POA: Diagnosis not present

## 2015-03-02 DIAGNOSIS — E781 Pure hyperglyceridemia: Secondary | ICD-10-CM | POA: Diagnosis not present

## 2015-03-02 DIAGNOSIS — E119 Type 2 diabetes mellitus without complications: Secondary | ICD-10-CM | POA: Diagnosis not present

## 2015-03-02 DIAGNOSIS — E559 Vitamin D deficiency, unspecified: Secondary | ICD-10-CM | POA: Diagnosis not present

## 2015-03-02 DIAGNOSIS — R938 Abnormal findings on diagnostic imaging of other specified body structures: Secondary | ICD-10-CM | POA: Diagnosis not present

## 2015-03-02 DIAGNOSIS — K760 Fatty (change of) liver, not elsewhere classified: Secondary | ICD-10-CM | POA: Diagnosis not present

## 2015-03-02 DIAGNOSIS — Z7984 Long term (current) use of oral hypoglycemic drugs: Secondary | ICD-10-CM | POA: Diagnosis not present

## 2015-03-02 DIAGNOSIS — N921 Excessive and frequent menstruation with irregular cycle: Secondary | ICD-10-CM | POA: Diagnosis not present

## 2015-03-02 DIAGNOSIS — Z79899 Other long term (current) drug therapy: Secondary | ICD-10-CM | POA: Diagnosis not present

## 2015-10-11 DIAGNOSIS — N939 Abnormal uterine and vaginal bleeding, unspecified: Secondary | ICD-10-CM | POA: Diagnosis not present

## 2015-10-24 DIAGNOSIS — J45909 Unspecified asthma, uncomplicated: Secondary | ICD-10-CM | POA: Diagnosis not present

## 2015-10-24 DIAGNOSIS — N939 Abnormal uterine and vaginal bleeding, unspecified: Secondary | ICD-10-CM | POA: Diagnosis not present

## 2015-10-24 DIAGNOSIS — E039 Hypothyroidism, unspecified: Secondary | ICD-10-CM | POA: Diagnosis not present

## 2015-10-24 DIAGNOSIS — F17200 Nicotine dependence, unspecified, uncomplicated: Secondary | ICD-10-CM | POA: Diagnosis not present

## 2015-10-24 DIAGNOSIS — I10 Essential (primary) hypertension: Secondary | ICD-10-CM | POA: Diagnosis not present

## 2015-10-24 DIAGNOSIS — Z79899 Other long term (current) drug therapy: Secondary | ICD-10-CM | POA: Diagnosis not present

## 2015-10-24 DIAGNOSIS — E119 Type 2 diabetes mellitus without complications: Secondary | ICD-10-CM | POA: Diagnosis not present

## 2015-10-24 DIAGNOSIS — F329 Major depressive disorder, single episode, unspecified: Secondary | ICD-10-CM | POA: Diagnosis not present

## 2015-10-24 DIAGNOSIS — Z9851 Tubal ligation status: Secondary | ICD-10-CM | POA: Diagnosis not present

## 2015-10-24 DIAGNOSIS — Z794 Long term (current) use of insulin: Secondary | ICD-10-CM | POA: Diagnosis not present

## 2015-10-24 DIAGNOSIS — F419 Anxiety disorder, unspecified: Secondary | ICD-10-CM | POA: Diagnosis not present

## 2015-10-24 DIAGNOSIS — N920 Excessive and frequent menstruation with regular cycle: Secondary | ICD-10-CM | POA: Diagnosis not present

## 2015-10-29 DIAGNOSIS — E782 Mixed hyperlipidemia: Secondary | ICD-10-CM | POA: Diagnosis not present

## 2015-10-29 DIAGNOSIS — K3184 Gastroparesis: Secondary | ICD-10-CM | POA: Diagnosis not present

## 2015-10-29 DIAGNOSIS — E119 Type 2 diabetes mellitus without complications: Secondary | ICD-10-CM | POA: Diagnosis not present

## 2015-10-29 DIAGNOSIS — J302 Other seasonal allergic rhinitis: Secondary | ICD-10-CM | POA: Diagnosis not present

## 2015-10-29 DIAGNOSIS — F329 Major depressive disorder, single episode, unspecified: Secondary | ICD-10-CM | POA: Diagnosis not present

## 2015-11-12 DIAGNOSIS — D649 Anemia, unspecified: Secondary | ICD-10-CM | POA: Diagnosis not present

## 2015-11-12 DIAGNOSIS — E119 Type 2 diabetes mellitus without complications: Secondary | ICD-10-CM | POA: Diagnosis not present

## 2015-11-12 DIAGNOSIS — J209 Acute bronchitis, unspecified: Secondary | ICD-10-CM | POA: Diagnosis not present

## 2015-11-12 DIAGNOSIS — H6691 Otitis media, unspecified, right ear: Secondary | ICD-10-CM | POA: Diagnosis not present

## 2015-12-31 DIAGNOSIS — F1220 Cannabis dependence, uncomplicated: Secondary | ICD-10-CM | POA: Diagnosis not present

## 2016-01-02 DIAGNOSIS — E785 Hyperlipidemia, unspecified: Secondary | ICD-10-CM | POA: Diagnosis not present

## 2016-01-02 DIAGNOSIS — K219 Gastro-esophageal reflux disease without esophagitis: Secondary | ICD-10-CM | POA: Diagnosis not present

## 2016-01-02 DIAGNOSIS — E039 Hypothyroidism, unspecified: Secondary | ICD-10-CM | POA: Diagnosis not present

## 2016-01-02 DIAGNOSIS — F319 Bipolar disorder, unspecified: Secondary | ICD-10-CM | POA: Diagnosis not present

## 2016-01-02 DIAGNOSIS — E1165 Type 2 diabetes mellitus with hyperglycemia: Secondary | ICD-10-CM | POA: Diagnosis not present

## 2016-01-02 DIAGNOSIS — Z1389 Encounter for screening for other disorder: Secondary | ICD-10-CM | POA: Diagnosis not present

## 2016-01-07 DIAGNOSIS — F1220 Cannabis dependence, uncomplicated: Secondary | ICD-10-CM | POA: Diagnosis not present

## 2016-01-18 DIAGNOSIS — F1220 Cannabis dependence, uncomplicated: Secondary | ICD-10-CM | POA: Diagnosis not present

## 2016-01-28 DIAGNOSIS — F1220 Cannabis dependence, uncomplicated: Secondary | ICD-10-CM | POA: Diagnosis not present

## 2016-02-11 DIAGNOSIS — F1220 Cannabis dependence, uncomplicated: Secondary | ICD-10-CM | POA: Diagnosis not present

## 2016-02-18 DIAGNOSIS — F1220 Cannabis dependence, uncomplicated: Secondary | ICD-10-CM | POA: Diagnosis not present

## 2016-03-03 DIAGNOSIS — F1220 Cannabis dependence, uncomplicated: Secondary | ICD-10-CM | POA: Diagnosis not present

## 2016-04-08 DIAGNOSIS — Z23 Encounter for immunization: Secondary | ICD-10-CM | POA: Diagnosis not present

## 2016-04-08 DIAGNOSIS — E1165 Type 2 diabetes mellitus with hyperglycemia: Secondary | ICD-10-CM | POA: Diagnosis not present

## 2016-04-08 DIAGNOSIS — E785 Hyperlipidemia, unspecified: Secondary | ICD-10-CM | POA: Diagnosis not present

## 2016-04-08 DIAGNOSIS — J309 Allergic rhinitis, unspecified: Secondary | ICD-10-CM | POA: Diagnosis not present

## 2016-04-08 DIAGNOSIS — F319 Bipolar disorder, unspecified: Secondary | ICD-10-CM | POA: Diagnosis not present

## 2016-04-08 DIAGNOSIS — K219 Gastro-esophageal reflux disease without esophagitis: Secondary | ICD-10-CM | POA: Diagnosis not present

## 2016-04-08 DIAGNOSIS — E039 Hypothyroidism, unspecified: Secondary | ICD-10-CM | POA: Diagnosis not present

## 2016-04-15 DIAGNOSIS — F3181 Bipolar II disorder: Secondary | ICD-10-CM | POA: Diagnosis not present

## 2016-04-16 DIAGNOSIS — H11153 Pinguecula, bilateral: Secondary | ICD-10-CM | POA: Diagnosis not present

## 2016-04-16 DIAGNOSIS — E119 Type 2 diabetes mellitus without complications: Secondary | ICD-10-CM | POA: Diagnosis not present

## 2016-04-22 DIAGNOSIS — J208 Acute bronchitis due to other specified organisms: Secondary | ICD-10-CM | POA: Diagnosis not present

## 2016-04-22 DIAGNOSIS — J019 Acute sinusitis, unspecified: Secondary | ICD-10-CM | POA: Diagnosis not present

## 2016-06-03 DIAGNOSIS — F3181 Bipolar II disorder: Secondary | ICD-10-CM | POA: Diagnosis not present

## 2016-07-04 DIAGNOSIS — F3181 Bipolar II disorder: Secondary | ICD-10-CM | POA: Diagnosis not present

## 2016-07-16 DIAGNOSIS — E1165 Type 2 diabetes mellitus with hyperglycemia: Secondary | ICD-10-CM | POA: Diagnosis not present

## 2016-07-16 DIAGNOSIS — Z6839 Body mass index (BMI) 39.0-39.9, adult: Secondary | ICD-10-CM | POA: Diagnosis not present

## 2016-07-16 DIAGNOSIS — E039 Hypothyroidism, unspecified: Secondary | ICD-10-CM | POA: Diagnosis not present

## 2016-07-16 DIAGNOSIS — E785 Hyperlipidemia, unspecified: Secondary | ICD-10-CM | POA: Diagnosis not present

## 2016-07-29 DIAGNOSIS — F3181 Bipolar II disorder: Secondary | ICD-10-CM | POA: Diagnosis not present

## 2016-09-09 DIAGNOSIS — F3181 Bipolar II disorder: Secondary | ICD-10-CM | POA: Diagnosis not present

## 2016-10-10 DIAGNOSIS — F3181 Bipolar II disorder: Secondary | ICD-10-CM | POA: Diagnosis not present

## 2016-10-20 DIAGNOSIS — E1165 Type 2 diabetes mellitus with hyperglycemia: Secondary | ICD-10-CM | POA: Diagnosis not present

## 2016-10-20 DIAGNOSIS — E785 Hyperlipidemia, unspecified: Secondary | ICD-10-CM | POA: Diagnosis not present

## 2016-10-20 DIAGNOSIS — E039 Hypothyroidism, unspecified: Secondary | ICD-10-CM | POA: Diagnosis not present

## 2016-12-26 DIAGNOSIS — F3181 Bipolar II disorder: Secondary | ICD-10-CM | POA: Diagnosis not present

## 2017-01-20 DIAGNOSIS — R112 Nausea with vomiting, unspecified: Secondary | ICD-10-CM | POA: Diagnosis not present

## 2017-01-20 DIAGNOSIS — E785 Hyperlipidemia, unspecified: Secondary | ICD-10-CM | POA: Diagnosis not present

## 2017-01-20 DIAGNOSIS — K219 Gastro-esophageal reflux disease without esophagitis: Secondary | ICD-10-CM | POA: Diagnosis not present

## 2017-01-20 DIAGNOSIS — E1140 Type 2 diabetes mellitus with diabetic neuropathy, unspecified: Secondary | ICD-10-CM | POA: Diagnosis not present

## 2017-01-20 DIAGNOSIS — F329 Major depressive disorder, single episode, unspecified: Secondary | ICD-10-CM | POA: Diagnosis not present

## 2017-01-20 DIAGNOSIS — E1165 Type 2 diabetes mellitus with hyperglycemia: Secondary | ICD-10-CM | POA: Diagnosis not present

## 2017-02-05 DIAGNOSIS — R1110 Vomiting, unspecified: Secondary | ICD-10-CM | POA: Diagnosis not present

## 2017-02-16 DIAGNOSIS — R110 Nausea: Secondary | ICD-10-CM | POA: Diagnosis not present

## 2017-02-16 DIAGNOSIS — R1110 Vomiting, unspecified: Secondary | ICD-10-CM | POA: Diagnosis not present

## 2017-02-20 DIAGNOSIS — K2950 Unspecified chronic gastritis without bleeding: Secondary | ICD-10-CM | POA: Diagnosis not present

## 2017-02-20 DIAGNOSIS — K3184 Gastroparesis: Secondary | ICD-10-CM | POA: Diagnosis not present

## 2017-02-20 DIAGNOSIS — R1110 Vomiting, unspecified: Secondary | ICD-10-CM | POA: Diagnosis not present

## 2017-02-20 DIAGNOSIS — R112 Nausea with vomiting, unspecified: Secondary | ICD-10-CM | POA: Diagnosis not present

## 2017-03-03 DIAGNOSIS — K3184 Gastroparesis: Secondary | ICD-10-CM | POA: Diagnosis not present

## 2017-03-05 DIAGNOSIS — K3184 Gastroparesis: Secondary | ICD-10-CM | POA: Diagnosis not present

## 2017-03-19 DIAGNOSIS — F3181 Bipolar II disorder: Secondary | ICD-10-CM | POA: Diagnosis not present

## 2017-04-20 DIAGNOSIS — F3181 Bipolar II disorder: Secondary | ICD-10-CM | POA: Diagnosis not present

## 2017-04-23 DIAGNOSIS — F319 Bipolar disorder, unspecified: Secondary | ICD-10-CM | POA: Diagnosis not present

## 2017-04-23 DIAGNOSIS — E1165 Type 2 diabetes mellitus with hyperglycemia: Secondary | ICD-10-CM | POA: Diagnosis not present

## 2017-04-23 DIAGNOSIS — E785 Hyperlipidemia, unspecified: Secondary | ICD-10-CM | POA: Diagnosis not present

## 2017-04-23 DIAGNOSIS — J4520 Mild intermittent asthma, uncomplicated: Secondary | ICD-10-CM | POA: Diagnosis not present

## 2017-04-23 DIAGNOSIS — R112 Nausea with vomiting, unspecified: Secondary | ICD-10-CM | POA: Diagnosis not present

## 2017-04-23 DIAGNOSIS — Z23 Encounter for immunization: Secondary | ICD-10-CM | POA: Diagnosis not present

## 2017-07-29 DIAGNOSIS — E1165 Type 2 diabetes mellitus with hyperglycemia: Secondary | ICD-10-CM | POA: Diagnosis not present

## 2017-07-29 DIAGNOSIS — Z1331 Encounter for screening for depression: Secondary | ICD-10-CM | POA: Diagnosis not present

## 2017-07-29 DIAGNOSIS — F319 Bipolar disorder, unspecified: Secondary | ICD-10-CM | POA: Diagnosis not present

## 2017-07-29 DIAGNOSIS — Z6839 Body mass index (BMI) 39.0-39.9, adult: Secondary | ICD-10-CM | POA: Diagnosis not present

## 2017-07-29 DIAGNOSIS — E1140 Type 2 diabetes mellitus with diabetic neuropathy, unspecified: Secondary | ICD-10-CM | POA: Diagnosis not present

## 2017-07-29 DIAGNOSIS — R112 Nausea with vomiting, unspecified: Secondary | ICD-10-CM | POA: Diagnosis not present

## 2017-07-29 DIAGNOSIS — J4520 Mild intermittent asthma, uncomplicated: Secondary | ICD-10-CM | POA: Diagnosis not present

## 2017-08-27 DIAGNOSIS — E119 Type 2 diabetes mellitus without complications: Secondary | ICD-10-CM | POA: Diagnosis not present

## 2017-11-23 DIAGNOSIS — F3181 Bipolar II disorder: Secondary | ICD-10-CM | POA: Diagnosis not present

## 2017-12-21 DIAGNOSIS — F3181 Bipolar II disorder: Secondary | ICD-10-CM | POA: Diagnosis not present

## 2017-12-31 DIAGNOSIS — F319 Bipolar disorder, unspecified: Secondary | ICD-10-CM | POA: Diagnosis not present

## 2017-12-31 DIAGNOSIS — E785 Hyperlipidemia, unspecified: Secondary | ICD-10-CM | POA: Diagnosis not present

## 2017-12-31 DIAGNOSIS — E1165 Type 2 diabetes mellitus with hyperglycemia: Secondary | ICD-10-CM | POA: Diagnosis not present

## 2017-12-31 DIAGNOSIS — F17200 Nicotine dependence, unspecified, uncomplicated: Secondary | ICD-10-CM | POA: Diagnosis not present

## 2017-12-31 DIAGNOSIS — E1140 Type 2 diabetes mellitus with diabetic neuropathy, unspecified: Secondary | ICD-10-CM | POA: Diagnosis not present

## 2017-12-31 DIAGNOSIS — Z0000 Encounter for general adult medical examination without abnormal findings: Secondary | ICD-10-CM | POA: Diagnosis not present

## 2017-12-31 DIAGNOSIS — Z6838 Body mass index (BMI) 38.0-38.9, adult: Secondary | ICD-10-CM | POA: Diagnosis not present

## 2017-12-31 DIAGNOSIS — J4520 Mild intermittent asthma, uncomplicated: Secondary | ICD-10-CM | POA: Diagnosis not present

## 2018-01-26 DIAGNOSIS — Z1231 Encounter for screening mammogram for malignant neoplasm of breast: Secondary | ICD-10-CM | POA: Diagnosis not present

## 2018-03-22 DIAGNOSIS — F3181 Bipolar II disorder: Secondary | ICD-10-CM | POA: Diagnosis not present

## 2018-06-22 DIAGNOSIS — F3181 Bipolar II disorder: Secondary | ICD-10-CM | POA: Diagnosis not present

## 2018-07-19 DIAGNOSIS — J4520 Mild intermittent asthma, uncomplicated: Secondary | ICD-10-CM | POA: Diagnosis not present

## 2018-07-19 DIAGNOSIS — F319 Bipolar disorder, unspecified: Secondary | ICD-10-CM | POA: Diagnosis not present

## 2018-07-19 DIAGNOSIS — K219 Gastro-esophageal reflux disease without esophagitis: Secondary | ICD-10-CM | POA: Diagnosis not present

## 2018-07-19 DIAGNOSIS — Z6835 Body mass index (BMI) 35.0-35.9, adult: Secondary | ICD-10-CM | POA: Diagnosis not present

## 2018-07-19 DIAGNOSIS — E039 Hypothyroidism, unspecified: Secondary | ICD-10-CM | POA: Diagnosis not present

## 2018-07-19 DIAGNOSIS — E1140 Type 2 diabetes mellitus with diabetic neuropathy, unspecified: Secondary | ICD-10-CM | POA: Diagnosis not present

## 2018-07-19 DIAGNOSIS — E1165 Type 2 diabetes mellitus with hyperglycemia: Secondary | ICD-10-CM | POA: Diagnosis not present

## 2018-07-19 DIAGNOSIS — E785 Hyperlipidemia, unspecified: Secondary | ICD-10-CM | POA: Diagnosis not present

## 2018-09-29 DIAGNOSIS — F3181 Bipolar II disorder: Secondary | ICD-10-CM | POA: Diagnosis not present

## 2018-12-08 DIAGNOSIS — F3181 Bipolar II disorder: Secondary | ICD-10-CM | POA: Diagnosis not present

## 2019-01-04 DIAGNOSIS — E785 Hyperlipidemia, unspecified: Secondary | ICD-10-CM | POA: Diagnosis not present

## 2019-01-04 DIAGNOSIS — Z9181 History of falling: Secondary | ICD-10-CM | POA: Diagnosis not present

## 2019-01-04 DIAGNOSIS — Z1231 Encounter for screening mammogram for malignant neoplasm of breast: Secondary | ICD-10-CM | POA: Diagnosis not present

## 2019-01-04 DIAGNOSIS — T730XXA Starvation, initial encounter: Secondary | ICD-10-CM | POA: Diagnosis not present

## 2019-01-04 DIAGNOSIS — Z6835 Body mass index (BMI) 35.0-35.9, adult: Secondary | ICD-10-CM | POA: Diagnosis not present

## 2019-01-04 DIAGNOSIS — Z0000 Encounter for general adult medical examination without abnormal findings: Secondary | ICD-10-CM | POA: Diagnosis not present

## 2019-01-25 DIAGNOSIS — K219 Gastro-esophageal reflux disease without esophagitis: Secondary | ICD-10-CM | POA: Diagnosis not present

## 2019-01-25 DIAGNOSIS — E1165 Type 2 diabetes mellitus with hyperglycemia: Secondary | ICD-10-CM | POA: Diagnosis not present

## 2019-01-25 DIAGNOSIS — E1140 Type 2 diabetes mellitus with diabetic neuropathy, unspecified: Secondary | ICD-10-CM | POA: Diagnosis not present

## 2019-01-25 DIAGNOSIS — Z6835 Body mass index (BMI) 35.0-35.9, adult: Secondary | ICD-10-CM | POA: Diagnosis not present

## 2019-01-25 DIAGNOSIS — E039 Hypothyroidism, unspecified: Secondary | ICD-10-CM | POA: Diagnosis not present

## 2019-01-25 DIAGNOSIS — Z87891 Personal history of nicotine dependence: Secondary | ICD-10-CM | POA: Diagnosis not present

## 2019-01-25 DIAGNOSIS — F319 Bipolar disorder, unspecified: Secondary | ICD-10-CM | POA: Diagnosis not present

## 2019-01-25 DIAGNOSIS — J4520 Mild intermittent asthma, uncomplicated: Secondary | ICD-10-CM | POA: Diagnosis not present

## 2019-01-25 DIAGNOSIS — E785 Hyperlipidemia, unspecified: Secondary | ICD-10-CM | POA: Diagnosis not present

## 2019-02-09 DIAGNOSIS — F3181 Bipolar II disorder: Secondary | ICD-10-CM | POA: Diagnosis not present

## 2019-03-11 DIAGNOSIS — K3184 Gastroparesis: Secondary | ICD-10-CM | POA: Diagnosis not present

## 2019-03-11 DIAGNOSIS — K219 Gastro-esophageal reflux disease without esophagitis: Secondary | ICD-10-CM | POA: Diagnosis not present

## 2019-03-11 DIAGNOSIS — Z23 Encounter for immunization: Secondary | ICD-10-CM | POA: Diagnosis not present

## 2019-04-12 DIAGNOSIS — Z0000 Encounter for general adult medical examination without abnormal findings: Secondary | ICD-10-CM | POA: Diagnosis not present

## 2019-04-12 DIAGNOSIS — E1165 Type 2 diabetes mellitus with hyperglycemia: Secondary | ICD-10-CM | POA: Diagnosis not present

## 2019-04-12 DIAGNOSIS — Z124 Encounter for screening for malignant neoplasm of cervix: Secondary | ICD-10-CM | POA: Diagnosis not present

## 2019-04-12 DIAGNOSIS — Z1239 Encounter for other screening for malignant neoplasm of breast: Secondary | ICD-10-CM | POA: Diagnosis not present

## 2019-04-12 DIAGNOSIS — Z6836 Body mass index (BMI) 36.0-36.9, adult: Secondary | ICD-10-CM | POA: Diagnosis not present

## 2019-04-29 DIAGNOSIS — F3181 Bipolar II disorder: Secondary | ICD-10-CM | POA: Diagnosis not present

## 2019-05-10 DIAGNOSIS — M545 Low back pain: Secondary | ICD-10-CM | POA: Diagnosis not present

## 2019-05-10 DIAGNOSIS — M533 Sacrococcygeal disorders, not elsewhere classified: Secondary | ICD-10-CM | POA: Diagnosis not present

## 2019-05-10 DIAGNOSIS — Z6837 Body mass index (BMI) 37.0-37.9, adult: Secondary | ICD-10-CM | POA: Diagnosis not present

## 2019-05-23 DIAGNOSIS — N898 Other specified noninflammatory disorders of vagina: Secondary | ICD-10-CM | POA: Diagnosis not present

## 2019-05-23 DIAGNOSIS — E1165 Type 2 diabetes mellitus with hyperglycemia: Secondary | ICD-10-CM | POA: Diagnosis not present

## 2019-05-23 DIAGNOSIS — Z1231 Encounter for screening mammogram for malignant neoplasm of breast: Secondary | ICD-10-CM | POA: Diagnosis not present

## 2019-05-31 DIAGNOSIS — Z6837 Body mass index (BMI) 37.0-37.9, adult: Secondary | ICD-10-CM | POA: Diagnosis not present

## 2019-05-31 DIAGNOSIS — E1165 Type 2 diabetes mellitus with hyperglycemia: Secondary | ICD-10-CM | POA: Diagnosis not present

## 2019-05-31 DIAGNOSIS — B373 Candidiasis of vulva and vagina: Secondary | ICD-10-CM | POA: Diagnosis not present

## 2019-06-08 DIAGNOSIS — E669 Obesity, unspecified: Secondary | ICD-10-CM | POA: Diagnosis not present

## 2019-06-08 DIAGNOSIS — E1140 Type 2 diabetes mellitus with diabetic neuropathy, unspecified: Secondary | ICD-10-CM | POA: Diagnosis not present

## 2019-06-08 DIAGNOSIS — E039 Hypothyroidism, unspecified: Secondary | ICD-10-CM | POA: Diagnosis not present

## 2019-06-08 DIAGNOSIS — Z6835 Body mass index (BMI) 35.0-35.9, adult: Secondary | ICD-10-CM | POA: Diagnosis not present

## 2019-06-08 DIAGNOSIS — E1165 Type 2 diabetes mellitus with hyperglycemia: Secondary | ICD-10-CM | POA: Diagnosis not present

## 2019-06-08 DIAGNOSIS — D539 Nutritional anemia, unspecified: Secondary | ICD-10-CM | POA: Diagnosis not present

## 2019-06-20 DIAGNOSIS — Z6836 Body mass index (BMI) 36.0-36.9, adult: Secondary | ICD-10-CM | POA: Diagnosis not present

## 2019-06-20 DIAGNOSIS — L739 Follicular disorder, unspecified: Secondary | ICD-10-CM | POA: Diagnosis not present

## 2019-06-24 DIAGNOSIS — Z1231 Encounter for screening mammogram for malignant neoplasm of breast: Secondary | ICD-10-CM | POA: Diagnosis not present

## 2019-07-14 DIAGNOSIS — Z6836 Body mass index (BMI) 36.0-36.9, adult: Secondary | ICD-10-CM | POA: Diagnosis not present

## 2019-07-14 DIAGNOSIS — E039 Hypothyroidism, unspecified: Secondary | ICD-10-CM | POA: Diagnosis not present

## 2019-07-14 DIAGNOSIS — E1140 Type 2 diabetes mellitus with diabetic neuropathy, unspecified: Secondary | ICD-10-CM | POA: Diagnosis not present

## 2019-07-14 DIAGNOSIS — K219 Gastro-esophageal reflux disease without esophagitis: Secondary | ICD-10-CM | POA: Diagnosis not present

## 2019-07-14 DIAGNOSIS — E1165 Type 2 diabetes mellitus with hyperglycemia: Secondary | ICD-10-CM | POA: Diagnosis not present

## 2019-07-14 DIAGNOSIS — E785 Hyperlipidemia, unspecified: Secondary | ICD-10-CM | POA: Diagnosis not present

## 2019-07-14 DIAGNOSIS — J4520 Mild intermittent asthma, uncomplicated: Secondary | ICD-10-CM | POA: Diagnosis not present

## 2019-07-14 DIAGNOSIS — F319 Bipolar disorder, unspecified: Secondary | ICD-10-CM | POA: Diagnosis not present

## 2019-07-29 DIAGNOSIS — F3181 Bipolar II disorder: Secondary | ICD-10-CM | POA: Diagnosis not present

## 2019-08-11 DIAGNOSIS — Z6836 Body mass index (BMI) 36.0-36.9, adult: Secondary | ICD-10-CM | POA: Diagnosis not present

## 2019-08-11 DIAGNOSIS — E1165 Type 2 diabetes mellitus with hyperglycemia: Secondary | ICD-10-CM | POA: Diagnosis not present

## 2019-08-26 DIAGNOSIS — F3181 Bipolar II disorder: Secondary | ICD-10-CM | POA: Diagnosis not present

## 2019-10-14 DIAGNOSIS — F3181 Bipolar II disorder: Secondary | ICD-10-CM | POA: Diagnosis not present

## 2019-11-14 DIAGNOSIS — K219 Gastro-esophageal reflux disease without esophagitis: Secondary | ICD-10-CM | POA: Diagnosis not present

## 2019-11-14 DIAGNOSIS — E785 Hyperlipidemia, unspecified: Secondary | ICD-10-CM | POA: Diagnosis not present

## 2019-11-14 DIAGNOSIS — E1165 Type 2 diabetes mellitus with hyperglycemia: Secondary | ICD-10-CM | POA: Diagnosis not present

## 2019-11-14 DIAGNOSIS — E039 Hypothyroidism, unspecified: Secondary | ICD-10-CM | POA: Diagnosis not present

## 2019-11-14 DIAGNOSIS — J4520 Mild intermittent asthma, uncomplicated: Secondary | ICD-10-CM | POA: Diagnosis not present

## 2019-11-14 DIAGNOSIS — Z6837 Body mass index (BMI) 37.0-37.9, adult: Secondary | ICD-10-CM | POA: Diagnosis not present

## 2019-11-14 DIAGNOSIS — E1140 Type 2 diabetes mellitus with diabetic neuropathy, unspecified: Secondary | ICD-10-CM | POA: Diagnosis not present

## 2019-11-14 DIAGNOSIS — F319 Bipolar disorder, unspecified: Secondary | ICD-10-CM | POA: Diagnosis not present

## 2019-12-30 DIAGNOSIS — F3181 Bipolar II disorder: Secondary | ICD-10-CM | POA: Diagnosis not present

## 2020-01-11 DIAGNOSIS — Z9181 History of falling: Secondary | ICD-10-CM | POA: Diagnosis not present

## 2020-01-11 DIAGNOSIS — E785 Hyperlipidemia, unspecified: Secondary | ICD-10-CM | POA: Diagnosis not present

## 2020-01-11 DIAGNOSIS — Z0000 Encounter for general adult medical examination without abnormal findings: Secondary | ICD-10-CM | POA: Diagnosis not present

## 2020-01-11 DIAGNOSIS — E669 Obesity, unspecified: Secondary | ICD-10-CM | POA: Diagnosis not present

## 2020-01-11 DIAGNOSIS — Z1331 Encounter for screening for depression: Secondary | ICD-10-CM | POA: Diagnosis not present

## 2020-01-11 DIAGNOSIS — Z6837 Body mass index (BMI) 37.0-37.9, adult: Secondary | ICD-10-CM | POA: Diagnosis not present

## 2020-02-10 DIAGNOSIS — F3181 Bipolar II disorder: Secondary | ICD-10-CM | POA: Diagnosis not present

## 2020-04-05 DIAGNOSIS — J4520 Mild intermittent asthma, uncomplicated: Secondary | ICD-10-CM | POA: Diagnosis not present

## 2020-04-05 DIAGNOSIS — E1140 Type 2 diabetes mellitus with diabetic neuropathy, unspecified: Secondary | ICD-10-CM | POA: Diagnosis not present

## 2020-04-05 DIAGNOSIS — R600 Localized edema: Secondary | ICD-10-CM | POA: Diagnosis not present

## 2020-04-05 DIAGNOSIS — E785 Hyperlipidemia, unspecified: Secondary | ICD-10-CM | POA: Diagnosis not present

## 2020-04-05 DIAGNOSIS — K219 Gastro-esophageal reflux disease without esophagitis: Secondary | ICD-10-CM | POA: Diagnosis not present

## 2020-04-05 DIAGNOSIS — E1165 Type 2 diabetes mellitus with hyperglycemia: Secondary | ICD-10-CM | POA: Diagnosis not present

## 2020-04-05 DIAGNOSIS — Z6838 Body mass index (BMI) 38.0-38.9, adult: Secondary | ICD-10-CM | POA: Diagnosis not present

## 2020-04-05 DIAGNOSIS — Z23 Encounter for immunization: Secondary | ICD-10-CM | POA: Diagnosis not present

## 2020-04-05 DIAGNOSIS — E039 Hypothyroidism, unspecified: Secondary | ICD-10-CM | POA: Diagnosis not present

## 2020-04-20 DIAGNOSIS — F3181 Bipolar II disorder: Secondary | ICD-10-CM | POA: Diagnosis not present

## 2020-06-14 DIAGNOSIS — F17200 Nicotine dependence, unspecified, uncomplicated: Secondary | ICD-10-CM | POA: Diagnosis not present

## 2020-06-14 DIAGNOSIS — M545 Low back pain, unspecified: Secondary | ICD-10-CM | POA: Diagnosis not present

## 2020-06-18 DIAGNOSIS — B029 Zoster without complications: Secondary | ICD-10-CM | POA: Diagnosis not present

## 2020-07-12 DIAGNOSIS — K219 Gastro-esophageal reflux disease without esophagitis: Secondary | ICD-10-CM | POA: Diagnosis not present

## 2020-07-12 DIAGNOSIS — Z6837 Body mass index (BMI) 37.0-37.9, adult: Secondary | ICD-10-CM | POA: Diagnosis not present

## 2020-07-12 DIAGNOSIS — J4520 Mild intermittent asthma, uncomplicated: Secondary | ICD-10-CM | POA: Diagnosis not present

## 2020-07-12 DIAGNOSIS — E1165 Type 2 diabetes mellitus with hyperglycemia: Secondary | ICD-10-CM | POA: Diagnosis not present

## 2020-07-12 DIAGNOSIS — E785 Hyperlipidemia, unspecified: Secondary | ICD-10-CM | POA: Diagnosis not present

## 2020-07-12 DIAGNOSIS — Z1231 Encounter for screening mammogram for malignant neoplasm of breast: Secondary | ICD-10-CM | POA: Diagnosis not present

## 2020-07-12 DIAGNOSIS — E1140 Type 2 diabetes mellitus with diabetic neuropathy, unspecified: Secondary | ICD-10-CM | POA: Diagnosis not present

## 2020-07-12 DIAGNOSIS — R600 Localized edema: Secondary | ICD-10-CM | POA: Diagnosis not present

## 2020-07-12 DIAGNOSIS — F3181 Bipolar II disorder: Secondary | ICD-10-CM | POA: Diagnosis not present

## 2020-07-12 DIAGNOSIS — E039 Hypothyroidism, unspecified: Secondary | ICD-10-CM | POA: Diagnosis not present

## 2020-08-03 DIAGNOSIS — Z1231 Encounter for screening mammogram for malignant neoplasm of breast: Secondary | ICD-10-CM | POA: Diagnosis not present

## 2020-09-27 DIAGNOSIS — F3181 Bipolar II disorder: Secondary | ICD-10-CM | POA: Diagnosis not present

## 2020-09-28 DIAGNOSIS — A499 Bacterial infection, unspecified: Secondary | ICD-10-CM | POA: Diagnosis not present

## 2020-09-28 DIAGNOSIS — N390 Urinary tract infection, site not specified: Secondary | ICD-10-CM | POA: Diagnosis not present

## 2020-09-28 DIAGNOSIS — R300 Dysuria: Secondary | ICD-10-CM | POA: Diagnosis not present

## 2020-10-09 DIAGNOSIS — F319 Bipolar disorder, unspecified: Secondary | ICD-10-CM | POA: Diagnosis not present

## 2020-10-09 DIAGNOSIS — K219 Gastro-esophageal reflux disease without esophagitis: Secondary | ICD-10-CM | POA: Diagnosis not present

## 2020-10-09 DIAGNOSIS — J4520 Mild intermittent asthma, uncomplicated: Secondary | ICD-10-CM | POA: Diagnosis not present

## 2020-10-09 DIAGNOSIS — E1140 Type 2 diabetes mellitus with diabetic neuropathy, unspecified: Secondary | ICD-10-CM | POA: Diagnosis not present

## 2020-10-09 DIAGNOSIS — E1165 Type 2 diabetes mellitus with hyperglycemia: Secondary | ICD-10-CM | POA: Diagnosis not present

## 2020-10-09 DIAGNOSIS — Z6837 Body mass index (BMI) 37.0-37.9, adult: Secondary | ICD-10-CM | POA: Diagnosis not present

## 2020-10-09 DIAGNOSIS — E785 Hyperlipidemia, unspecified: Secondary | ICD-10-CM | POA: Diagnosis not present

## 2020-10-09 DIAGNOSIS — E039 Hypothyroidism, unspecified: Secondary | ICD-10-CM | POA: Diagnosis not present

## 2020-12-13 DIAGNOSIS — F3181 Bipolar II disorder: Secondary | ICD-10-CM | POA: Diagnosis not present

## 2020-12-25 DIAGNOSIS — H52223 Regular astigmatism, bilateral: Secondary | ICD-10-CM | POA: Diagnosis not present

## 2020-12-25 DIAGNOSIS — H5213 Myopia, bilateral: Secondary | ICD-10-CM | POA: Diagnosis not present

## 2020-12-25 DIAGNOSIS — D3132 Benign neoplasm of left choroid: Secondary | ICD-10-CM | POA: Diagnosis not present

## 2020-12-25 DIAGNOSIS — E113293 Type 2 diabetes mellitus with mild nonproliferative diabetic retinopathy without macular edema, bilateral: Secondary | ICD-10-CM | POA: Diagnosis not present

## 2021-02-07 DIAGNOSIS — F319 Bipolar disorder, unspecified: Secondary | ICD-10-CM | POA: Diagnosis not present

## 2021-02-07 DIAGNOSIS — E039 Hypothyroidism, unspecified: Secondary | ICD-10-CM | POA: Diagnosis not present

## 2021-02-07 DIAGNOSIS — E1165 Type 2 diabetes mellitus with hyperglycemia: Secondary | ICD-10-CM | POA: Diagnosis not present

## 2021-02-07 DIAGNOSIS — Z6838 Body mass index (BMI) 38.0-38.9, adult: Secondary | ICD-10-CM | POA: Diagnosis not present

## 2021-02-07 DIAGNOSIS — E1140 Type 2 diabetes mellitus with diabetic neuropathy, unspecified: Secondary | ICD-10-CM | POA: Diagnosis not present

## 2021-02-07 DIAGNOSIS — E785 Hyperlipidemia, unspecified: Secondary | ICD-10-CM | POA: Diagnosis not present

## 2021-02-07 DIAGNOSIS — K219 Gastro-esophageal reflux disease without esophagitis: Secondary | ICD-10-CM | POA: Diagnosis not present

## 2021-02-07 DIAGNOSIS — J4520 Mild intermittent asthma, uncomplicated: Secondary | ICD-10-CM | POA: Diagnosis not present

## 2021-02-28 DIAGNOSIS — F3181 Bipolar II disorder: Secondary | ICD-10-CM | POA: Diagnosis not present

## 2021-03-11 DIAGNOSIS — E039 Hypothyroidism, unspecified: Secondary | ICD-10-CM | POA: Diagnosis not present

## 2021-03-11 DIAGNOSIS — R635 Abnormal weight gain: Secondary | ICD-10-CM | POA: Diagnosis not present

## 2021-03-11 DIAGNOSIS — E1165 Type 2 diabetes mellitus with hyperglycemia: Secondary | ICD-10-CM | POA: Diagnosis not present

## 2021-03-11 DIAGNOSIS — Z6838 Body mass index (BMI) 38.0-38.9, adult: Secondary | ICD-10-CM | POA: Diagnosis not present

## 2021-05-09 DIAGNOSIS — F3181 Bipolar II disorder: Secondary | ICD-10-CM | POA: Diagnosis not present

## 2021-06-13 DIAGNOSIS — K219 Gastro-esophageal reflux disease without esophagitis: Secondary | ICD-10-CM | POA: Diagnosis not present

## 2021-06-13 DIAGNOSIS — Z6839 Body mass index (BMI) 39.0-39.9, adult: Secondary | ICD-10-CM | POA: Diagnosis not present

## 2021-06-13 DIAGNOSIS — E039 Hypothyroidism, unspecified: Secondary | ICD-10-CM | POA: Diagnosis not present

## 2021-06-13 DIAGNOSIS — E1140 Type 2 diabetes mellitus with diabetic neuropathy, unspecified: Secondary | ICD-10-CM | POA: Diagnosis not present

## 2021-06-13 DIAGNOSIS — E785 Hyperlipidemia, unspecified: Secondary | ICD-10-CM | POA: Diagnosis not present

## 2021-06-13 DIAGNOSIS — F319 Bipolar disorder, unspecified: Secondary | ICD-10-CM | POA: Diagnosis not present

## 2021-06-13 DIAGNOSIS — J4520 Mild intermittent asthma, uncomplicated: Secondary | ICD-10-CM | POA: Diagnosis not present

## 2021-06-13 DIAGNOSIS — E1165 Type 2 diabetes mellitus with hyperglycemia: Secondary | ICD-10-CM | POA: Diagnosis not present

## 2021-07-25 DIAGNOSIS — F3181 Bipolar II disorder: Secondary | ICD-10-CM | POA: Diagnosis not present

## 2021-12-19 DIAGNOSIS — E1165 Type 2 diabetes mellitus with hyperglycemia: Secondary | ICD-10-CM | POA: Diagnosis not present

## 2021-12-19 DIAGNOSIS — J4520 Mild intermittent asthma, uncomplicated: Secondary | ICD-10-CM | POA: Diagnosis not present

## 2021-12-19 DIAGNOSIS — E785 Hyperlipidemia, unspecified: Secondary | ICD-10-CM | POA: Diagnosis not present

## 2021-12-19 DIAGNOSIS — F319 Bipolar disorder, unspecified: Secondary | ICD-10-CM | POA: Diagnosis not present

## 2021-12-19 DIAGNOSIS — E039 Hypothyroidism, unspecified: Secondary | ICD-10-CM | POA: Diagnosis not present

## 2021-12-19 DIAGNOSIS — F17200 Nicotine dependence, unspecified, uncomplicated: Secondary | ICD-10-CM | POA: Diagnosis not present

## 2021-12-19 DIAGNOSIS — Z794 Long term (current) use of insulin: Secondary | ICD-10-CM | POA: Diagnosis not present

## 2021-12-19 DIAGNOSIS — K219 Gastro-esophageal reflux disease without esophagitis: Secondary | ICD-10-CM | POA: Diagnosis not present

## 2021-12-19 DIAGNOSIS — E1140 Type 2 diabetes mellitus with diabetic neuropathy, unspecified: Secondary | ICD-10-CM | POA: Diagnosis not present

## 2022-02-27 DIAGNOSIS — F3181 Bipolar II disorder: Secondary | ICD-10-CM | POA: Diagnosis not present

## 2022-05-01 DIAGNOSIS — F3181 Bipolar II disorder: Secondary | ICD-10-CM | POA: Diagnosis not present

## 2022-05-29 DIAGNOSIS — F3181 Bipolar II disorder: Secondary | ICD-10-CM | POA: Diagnosis not present

## 2022-08-10 ENCOUNTER — Encounter (HOSPITAL_COMMUNITY): Payer: Self-pay

## 2022-08-10 ENCOUNTER — Ambulatory Visit (HOSPITAL_COMMUNITY)
Admission: EM | Admit: 2022-08-10 | Discharge: 2022-08-10 | Disposition: A | Discharge status: Home / Self Care | Payer: Medicare HMO

## 2022-08-10 DIAGNOSIS — H109 Unspecified conjunctivitis: Secondary | ICD-10-CM

## 2022-08-10 DIAGNOSIS — B9689 Other specified bacterial agents as the cause of diseases classified elsewhere: Secondary | ICD-10-CM | POA: Diagnosis not present

## 2022-08-10 DIAGNOSIS — J019 Acute sinusitis, unspecified: Secondary | ICD-10-CM | POA: Diagnosis not present

## 2022-08-10 HISTORY — DX: Anxiety disorder, unspecified: F41.9

## 2022-08-10 HISTORY — DX: Type 2 diabetes mellitus without complications: E11.9

## 2022-08-10 HISTORY — DX: Borderline personality disorder: F60.3

## 2022-08-10 HISTORY — DX: Major depressive disorder, single episode, unspecified: F32.9

## 2022-08-10 HISTORY — DX: Bipolar disorder, unspecified: F31.9

## 2022-08-10 HISTORY — DX: Post-traumatic stress disorder, unspecified: F43.10

## 2022-08-10 MED ORDER — ERYTHROMYCIN 5 MG/GM OP OINT: TOPICAL_OINTMENT | Freq: Four times a day (QID) | OPHTHALMIC | 0 refills | Status: AC

## 2022-08-10 MED ORDER — AMOXICILLIN-POT CLAVULANATE 875-125 MG PO TABS: 1.0000 | ORAL_TABLET | Freq: Two times a day (BID) | ORAL | 0 refills | Status: AC

## 2022-08-10 NOTE — ED Triage Notes (Signed)
Patient reports that she has been sleeping at the [facility] And reports nasal congestion with green drainage x 4 days. Patient also c/o bilateral eye swelling L>R with yellow purulent drainage from her eyes.  Patient states she has not taken any medications for her symptoms.

## 2022-08-10 NOTE — Discharge Instructions (Signed)
You have a bacterial sinus infection.  Take the Augmentin as prescribed to treat it.  Symptoms should improve over the next week to 10 days.  If you develop chest pain or shortness of breath, go to the emergency room.  Some things that can make you feel better are: - Increased rest - Increasing fluid with water/sugar free electrolytes - Acetaminophen and ibuprofen as needed for fever/pain - Salt water gargling, chloraseptic spray and throat lozenges - OTC guaifenesin (Mucinex) 600 mg twice daily for congestion - Saline sinus flushes or a neti pot - Humidifying the air  Start the erythromycin ointment for pink eye and use it as prescribed in both eyes.

## 2022-08-10 NOTE — ED Provider Notes (Signed)
[facility]    CSN: [id number] Arrival date & time: 08/10/22  1048      History   Chief Complaint No chief complaint on file.   HPI Alyssa Patel is a 47 y.o. female.   Patient presents today with 3 to 4-day history of maxillary sinus pressure, nasal congestion, and green drainage from here nose.  She denies fever, bodyaches or chills, new congested or dry cough, shortness of breath or chest pain, runny nose, headache, ear pain, abdominal pain, nausea/vomiting, diarrhea, decreased appetite, and loss of taste or smell.  Reports she smokes and has a chronic congested cough and coughs up clear sputum.  Also endorses a sore throat.  Also concerned about bilateral eye redness that is worse in the left eye and has been going on for the past 3 days.  Reports woke up and the right eye was also red this morning.  She is having some yellow "pus" draining from her left eye.  Reports she does have occasional blurred/double vision when there is a a lot of exudate on her eye.  Has not take anything for symptoms so far.  Denies antibiotic use in the past 90 days as well as allergies to antibiotic therapy.    Past Medical History:  Diagnosis Date   Anxiety    Bipolar 1 disorder ([place])    Borderline personality disorder ([place])    Diabetes mellitus without complication ([place])    Major depressive disorder    PTSD (post-traumatic stress disorder)     There are no problems to display for this patient.   Past Surgical History:  Procedure Laterality Date   TUBAL LIGATION     uterine ablation      OB History   No obstetric history on file.      Home Medications    Prior to Admission medications   Medication Sig Start Date End Date Taking? Authorizing Provider  albuterol (PROVENTIL) (2.5 MG/3ML) 0.083% nebulizer solution Take 2.5 mg by nebulization every 4 (four) hours as needed for wheezing or shortness of breath.   Yes [provider]  amoxicillin-clavulanate  (AUGMENTIN) 875-125 MG tablet Take 1 tablet by mouth 2 (two) times daily for 7 days. 08/10/22 08/17/22 Yes Eulogio Bear, NP  atorvastatin (LIPITOR) 20 MG tablet Take 20 mg by mouth daily.   Yes [provider]  busPIRone (BUSPAR) 30 MG tablet Take 30 mg by mouth 3 (three) times daily.   Yes [provider]  erythromycin ophthalmic ointment Place into both eyes 4 (four) times daily for 7 days. Place a 1/2 inch ribbon of ointment into the lower eyelid. 08/10/22 08/17/22 Yes Eulogio Bear, NP  FLUoxetine (PROZAC) 40 MG capsule Take 60 mg by mouth daily.   Yes [provider]  gabapentin (NEURONTIN) 300 MG capsule Take 300 mg by mouth 2 (two) times daily.   Yes [provider]  glipiZIDE (GLUCOTROL) 10 MG tablet Take 10 mg by mouth 2 (two) times daily before a meal.   Yes [provider]  lamoTRIgine (LAMICTAL) 100 MG tablet Take 200 mg by mouth daily.   Yes [provider]  levothyroxine (SYNTHROID) 25 MCG tablet Take 25 mcg by mouth daily before breakfast.   Yes [provider]  lisinopril (ZESTRIL) 2.5 MG tablet Take 2.5 mg by mouth daily.   Yes [provider]  lurasidone (LATUDA) 40 MG TABS tablet Take 40 mg by mouth daily with breakfast.   Yes [provider]  metFORMIN (GLUMETZA) 1000 MG (MOD) 24 hr tablet Take 1,000 mg by mouth 2 (two) times daily with a meal.   Yes [provider]  omeprazole (PRILOSEC) 20 MG capsule Take 20 mg by mouth daily.   Yes [provider]  traZODone (DESYREL) 100 MG tablet Take 200 mg by mouth at bedtime.   Yes [provider]    Family History Family History  Problem Relation Age of Onset   Stroke Father    Diabetes Father     Social History Social History   Tobacco Use   Smoking status: Every Day    Packs/day: .5    Types: Cigarettes   Smokeless tobacco: Never  Vaping Use   Vaping Use: Every day  Substance Use Topics   Alcohol use:  Never   Drug use: Yes    Comment: Delta 8 vape     Allergies   Patient has no known allergies.   Review of Systems Review of Systems Per HPI  Physical Exam Triage Vital Signs ED Triage Vitals [08/10/22 1234]  Enc Vitals Group     BP (!) 144/85     Pulse Rate (!) 54     Resp 16     Temp 97.7 F (36.5 C)     Temp Source Oral     SpO2 97 %     Weight      Height      Head Circumference      Peak Flow      Pain Score 0     Pain Loc      Pain Edu?      Excl. in [place]?    No data found.  Updated Vital Signs BP (!) 144/85 (BP Location: Left Arm)   Pulse (!) 54   Temp 97.7 F (36.5 C) (Oral)   Resp 16   LMP 08/08/2022   SpO2 97%   Visual Acuity Right Eye Distance:   Left Eye Distance:   Bilateral Distance:    Right Eye Near:   Left Eye Near:    Bilateral Near:     Physical Exam Vitals and nursing note reviewed.  Constitutional:      General: She is not in acute distress.    Appearance: Normal appearance. She is not ill-appearing or toxic-appearing.  HENT:     Head: Normocephalic and atraumatic.     Right Ear: Tympanic membrane, ear canal and external ear normal.     Left Ear: Tympanic membrane, ear canal and external ear normal. There is impacted cerumen.     Nose: Congestion present. No rhinorrhea.     Right Sinus: Maxillary sinus tenderness present. No frontal sinus tenderness.     Left Sinus: Maxillary sinus tenderness present. No frontal sinus tenderness.     Mouth/Throat:     Mouth: Mucous membranes are moist.     Pharynx: Oropharynx is clear. No oropharyngeal exudate or posterior oropharyngeal erythema.  Eyes:     General: No scleral icterus.    Extraocular Movements: Extraocular movements intact.  Cardiovascular:     Rate and Rhythm: Normal rate and regular rhythm.  Pulmonary:     Effort: Pulmonary effort is normal. No respiratory distress.     Breath sounds: Normal breath sounds. No wheezing, rhonchi or rales.  Abdominal:     General: Abdomen  is flat. Bowel sounds are normal.     Palpations: Abdomen is soft.  Musculoskeletal:     Cervical back: Normal range of motion and neck  supple.  Lymphadenopathy:     Cervical: No cervical adenopathy.  Skin:    General: Skin is warm and dry.     Capillary Refill: Capillary refill takes less than 2 seconds.     Coloration: Skin is not jaundiced or pale.     Findings: No erythema or rash.  Neurological:     Mental Status: She is alert and oriented to person, place, and time.  Psychiatric:        Behavior: Behavior is cooperative.      UC Treatments / Results  Labs (all labs ordered are listed, but only abnormal results are displayed) Labs Reviewed - No data to display  EKG   Radiology No results found.  Procedures Procedures (including critical care time)  Medications Ordered in UC Medications - No data to display  Initial Impression / Assessment and Plan / UC Course  I have reviewed the triage vital signs and the nursing notes.  Pertinent labs & imaging results that were available during my care of the patient were reviewed by me and considered in my medical decision making (see chart for details).   Patient is well-appearing, normotensive, afebrile, not tachycardic, not tachypneic, oxygenating well on room air.    1. Acute bacterial sinusitis Vital signs and examination today are reassuring Given length of sinus pressure, will treat for bacterial sinusitis with Augmentin twice daily for 7 days Supportive care discussed including Mucinex, pushing hydration with water, nasal saline rinses if able ER and return precautions discussed with patient  2. Conjunctivitis of both eyes, unspecified conjunctivitis type Suspect bacterial conjunctivitis Treat with erythromycin ointment 4 times a day for 7 days Discussed warm compresses, encouraged good hand hygiene Seek care for persistent or worsening symptoms despite treatment  The patient was given the opportunity to ask  questions.  All questions answered to their satisfaction.  The patient is in agreement to this plan.    Final Clinical Impressions(s) / UC Diagnoses   Final diagnoses:  Acute bacterial sinusitis  Conjunctivitis of both eyes, unspecified conjunctivitis type     Discharge Instructions      You have a bacterial sinus infection.  Take the Augmentin as prescribed to treat it.  Symptoms should improve over the next week to 10 days.  If you develop chest pain or shortness of breath, go to the emergency room.  Some things that can make you feel better are: - Increased rest - Increasing fluid with water/sugar free electrolytes - Acetaminophen and ibuprofen as needed for fever/pain - Salt water gargling, chloraseptic spray and throat lozenges - OTC guaifenesin (Mucinex) 600 mg twice daily for congestion - Saline sinus flushes or a neti pot - Humidifying the air  Start the erythromycin ointment for pink eye and use it as prescribed in both eyes.      ED Prescriptions     Medication Sig Dispense Auth. Provider   erythromycin ophthalmic ointment Place into both eyes 4 (four) times daily for 7 days. Place a 1/2 inch ribbon of ointment into the lower eyelid. 3.5 g Noemi Chapel A, NP   amoxicillin-clavulanate (AUGMENTIN) 875-125 MG tablet Take 1 tablet by mouth 2 (two) times daily for 7 days. 14 tablet Eulogio Bear, NP      PDMP not reviewed this encounter.   Eulogio Bear, NP 08/10/22 [phone]

## 2022-09-23 ENCOUNTER — Other Ambulatory Visit: Payer: Self-pay | Admitting: Nurse Practitioner

## 2022-09-23 DIAGNOSIS — F17210 Nicotine dependence, cigarettes, uncomplicated: Secondary | ICD-10-CM

## 2024-03-07 ENCOUNTER — Encounter: Payer: Self-pay | Admitting: Gastroenterology

## 2024-03-09 ENCOUNTER — Ambulatory Visit

## 2024-03-09 VITALS — Ht 64.0 in | Wt 232.4 lb

## 2024-03-09 DIAGNOSIS — Z1211 Encounter for screening for malignant neoplasm of colon: Secondary | ICD-10-CM

## 2024-03-09 MED ORDER — ONDANSETRON HCL 4 MG PO TABS: 4.0000 mg | ORAL_TABLET | Freq: Three times a day (TID) | ORAL | 1 refills | Status: AC | PRN

## 2024-03-09 MED ORDER — NA SULFATE-K SULFATE-MG SULF 17.5-3.13-1.6 GM/177ML PO SOLN: 1.0000 | Freq: Once | ORAL | 0 refills | Status: AC

## 2024-03-09 NOTE — Progress Notes (Signed)
 No issues known to pt with past sedation with any surgeries or procedures Patient denies ever being told they had issues or difficulty with intubation  No FH of Malignant Hyperthermia Pt is not on diet pills nor GLP-1 medications Pt is not on home 02  Pt is not on blood thinners  Pt denies issues with chronic constipation  No A fib or A flutter Have any cardiac testing pending--no Pt instructed to use [email or web address] or GoodRx for a price reduction on prep  Patient snores; PCP states she likely has OSA; RN educated patient about importance of C-Pap if patient does have OSA Ambulates independently

## 2024-03-14 ENCOUNTER — Telehealth: Payer: Self-pay

## 2024-03-14 ENCOUNTER — Telehealth: Payer: Self-pay | Admitting: Gastroenterology

## 2024-03-14 MED ORDER — NA SULFATE-K SULFATE-MG SULF 17.5-3.13-1.6 GM/177ML PO SOLN: 1.0000 | Freq: Once | ORAL | 0 refills | Status: AC

## 2024-03-14 NOTE — Telephone Encounter (Signed)
 RN attempted to call patient to inform her that we have sent her Suprep bowel medication to the requested pharmacy, [facility] on [address] in [place]. RN unable to reach patient or leave vm.

## 2024-03-14 NOTE — Telephone Encounter (Signed)
 Inbound call from patient stating th she has still no received her prep medication and is requesting that we send over her suprep and zoloft to [facility] on [address]. Please advise.

## 2024-03-21 ENCOUNTER — Ambulatory Visit (AMBULATORY_SURGERY_CENTER): Admitting: Gastroenterology

## 2024-03-21 ENCOUNTER — Encounter: Payer: Self-pay | Admitting: Gastroenterology

## 2024-03-21 VITALS — BP 128/64 | HR 62 | Temp 98.3°F | Resp 15 | Ht 64.0 in | Wt 232.4 lb

## 2024-03-21 DIAGNOSIS — Z1211 Encounter for screening for malignant neoplasm of colon: Secondary | ICD-10-CM

## 2024-03-21 DIAGNOSIS — K5730 Diverticulosis of large intestine without perforation or abscess without bleeding: Secondary | ICD-10-CM

## 2024-03-21 DIAGNOSIS — K621 Rectal polyp: Secondary | ICD-10-CM | POA: Diagnosis not present

## 2024-03-21 DIAGNOSIS — K635 Polyp of colon: Secondary | ICD-10-CM

## 2024-03-21 DIAGNOSIS — D120 Benign neoplasm of cecum: Secondary | ICD-10-CM | POA: Diagnosis not present

## 2024-03-21 DIAGNOSIS — D128 Benign neoplasm of rectum: Secondary | ICD-10-CM

## 2024-03-21 DIAGNOSIS — D125 Benign neoplasm of sigmoid colon: Secondary | ICD-10-CM

## 2024-03-21 MED ORDER — SODIUM CHLORIDE 0.9 % IV SOLN: 500.0000 mL | INTRAVENOUS | Status: DC

## 2024-03-21 NOTE — Progress Notes (Signed)
 Called to room to assist during endoscopic procedure.  Patient ID and intended procedure confirmed with present staff. Received instructions for my participation in the procedure from the performing physician.

## 2024-03-21 NOTE — Op Note (Signed)
 [facility] Patient Name: Alyssa Patel Procedure Date: 03/21/2024 11:10 AM MRN: [id number] Endoscopist: Sandor Flatter , MD, [id number] Age: 48 Referring MD:  Date of Birth: 08/21/1975 Gender: Female Account #: [id number] Procedure:                Colonoscopy Indications:              Screening for colorectal malignant neoplasm, This                            is the patient's first colonoscopy.                           Did have a recent +FIT with her PCP. No overt                            bleeding. Father and PGF with colon polyps, but no                            known family history of colon cancer. Medicines:                Monitored Anesthesia Care Procedure:                Pre-Anesthesia Assessment:                           - Prior to the procedure, a History and Physical                            was performed, and patient medications and                            allergies were reviewed. The patient's tolerance of                            previous anesthesia was also reviewed. The risks                            and benefits of the procedure and the sedation                            options and risks were discussed with the patient.                            All questions were answered, and informed consent                            was obtained. Prior Anticoagulants: The patient has                            taken no anticoagulant or antiplatelet agents. ASA                            Grade Assessment: III - A patient with severe  systemic disease. After reviewing the risks and                            benefits, the patient was deemed in satisfactory                            condition to undergo the procedure.                           After obtaining informed consent, the colonoscope                            was passed under direct vision. Throughout the                            procedure, the patient's blood  pressure, pulse, and                            oxygen saturations were monitored continuously. The                            Olympus Scope SN: [id number] was introduced through                            the anus and advanced to the the cecum, identified                            by appendiceal orifice and ileocecal valve. The                            colonoscopy was performed without difficulty. The                            patient tolerated the procedure well. The quality                            of the bowel preparation was good. The ileocecal                            valve, appendiceal orifice, and rectum were                            photographed. Scope In: 11:32:37 AM Scope Out: 11:57:09 AM Scope Withdrawal Time: 0 hours 15 minutes 12 seconds  Total Procedure Duration: 0 hours 24 minutes 32 seconds  Findings:                 The perianal and digital rectal examinations were                            normal.                           A 13 mm polyp was found in the cecum. The polyp was  sessile. The polyp was removed with a cold snare.                            Resection and retrieval were complete. Estimated                            blood loss was minimal.                           A 4 mm polyp was found in the sigmoid colon. The                            polyp was sessile. The polyp was removed with a                            cold snare. Resection and retrieval were complete.                            Estimated blood loss was minimal.                           Five sessile polyps were found in the rectum. The                            polyps were 2 to 3 mm in size. These polyps were                            removed with a cold snare. Resection and retrieval                            were complete. Estimated blood loss was minimal.                           A few small-mouthed diverticula were found in the                             sigmoid colon.                           The retroflexed view of the distal rectum and anal                            verge was normal and showed no anal or rectal                            abnormalities. Complications:            No immediate complications. Estimated Blood Loss:     Estimated blood loss was minimal. Impression:               - One 13 mm polyp in the cecum, removed with a cold                            snare. Resected  and retrieved.                           - One 4 mm polyp in the sigmoid colon, removed with                            a cold snare. Resected and retrieved.                           - Five 2 to 3 mm polyps in the rectum, removed with                            a cold snare. Resected and retrieved.                           - Diverticulosis in the sigmoid colon.                           - The distal rectum and anal verge are normal on                            retroflexion view. Recommendation:           - Patient has a contact number available for                            emergencies. The signs and symptoms of potential                            delayed complications were discussed with the                            patient. Return to normal activities tomorrow.                            Written discharge instructions were provided to the                            patient.                           - Resume previous diet.                           - Continue present medications.                           - Await pathology results.                           - Repeat colonoscopy for surveillance based on                            pathology results.                           -  Return to GI office PRN. Sandor Flatter, MD 03/21/2024 12:05:52 PM

## 2024-03-21 NOTE — Patient Instructions (Addendum)
 Resume previous diet  Continue present medications  Await pathology results  Repeat colonoscopy for surveillance based on pathology results   Handouts given for polyps and diverticulosis.  YOU HAD AN ENDOSCOPIC PROCEDURE TODAY AT THE [facility]:   Refer to the procedure report that was given to you for any specific questions about what was found during the examination.  If the procedure report does not answer your questions, please call your gastroenterologist to clarify.  If you requested that your care partner not be given the details of your procedure findings, then the procedure report has been included in a sealed envelope for you to review at your convenience later.  YOU SHOULD EXPECT: Some feelings of bloating in the abdomen. Passage of more gas than usual.  Walking can help get rid of the air that was put into your GI tract during the procedure and reduce the bloating. If you had a lower endoscopy (such as a colonoscopy or flexible sigmoidoscopy) you may notice spotting of blood in your stool or on the toilet paper. If you underwent a bowel prep for your procedure, you may not have a normal bowel movement for a few days.  Please Note:  You might notice some irritation and congestion in your nose or some drainage.  This is from the oxygen used during your procedure.  There is no need for concern and it should clear up in a day or so.  SYMPTOMS TO REPORT IMMEDIATELY: Following lower endoscopy (colonoscopy):  Excessive amounts of blood in the stool  Significant tenderness or worsening of abdominal pains  Swelling of the abdomen that is new, acute  Fever of 100F or higher For urgent or emergent issues, a gastroenterologist can be reached at any hour by calling [phone]. Do not use MyChart messaging for urgent concerns.   DIET:  We do recommend a small meal at first, but then you may proceed to your regular diet.  Drink plenty of fluids but you should avoid alcoholic  beverages for 24 hours.  ACTIVITY:  You should plan to take it easy for the rest of today and you should NOT DRIVE or use heavy machinery until tomorrow (because of the sedation medicines used during the test).    FOLLOW UP: Our staff will call the number listed on your records the next business day following your procedure.  We will call around 7:15- 8:00 am to check on you and address any questions or concerns that you may have regarding the information given to you following your procedure. If we do not reach you, we will leave a message.     If any biopsies were taken you will be contacted by phone or by letter within the next 1-3 weeks.  Please call us  at [phone] if you have not heard about the biopsies in 3 weeks.   SIGNATURES/CONFIDENTIALITY: You and/or your care partner have signed paperwork which will be entered into your electronic medical record.  These signatures attest to the fact that that the information above on your After Visit Summary has been reviewed and is understood.  Full responsibility of the confidentiality of this discharge information lies with you and/or your care-partner.

## 2024-03-21 NOTE — Progress Notes (Signed)
 Pt's states no medical or surgical changes since previsit or office visit.

## 2024-03-21 NOTE — Progress Notes (Signed)
 GASTROENTEROLOGY PROCEDURE H&P NOTE   Primary Care Physician: Arloa Jarvis, NP    Reason for Procedure:  Colon Cancer screening  Plan:    Colonoscopy  Patient is appropriate for endoscopic procedure(s) in the ambulatory ([facility]) setting.  The nature of the procedure, as well as the risks, benefits, and alternatives were carefully and thoroughly reviewed with the patient. Ample time for discussion and questions allowed. The patient understood, was satisfied, and agreed to proceed.     HPI: Alyssa Patel is a 48 y.o. female who presents for colonoscopy for routine Colon Cancer screening.  No active GI symptoms. Did have a recent +FIT with her PCP. No overt bleeding. Father and PGF with colon polyps, but no known family history of colon cancer or related malignancy.  Patient is otherwise without complaints or active issues today.  Past Medical History:  Diagnosis Date   Anemia    Anxiety    Asthma    Bipolar 1 disorder (HCC)    Borderline personality disorder (HCC)    Chronic kidney disease    Diabetes mellitus without complication (HCC)    Hyperlipidemia    Major depressive disorder    PTSD (post-traumatic stress disorder)    Thyroid  disease     Past Surgical History:  Procedure Laterality Date   TUBAL LIGATION     uterine ablation      Prior to Admission medications   Medication Sig Start Date End Date Taking? Authorizing Provider  albuterol  (VENTOLIN  HFA) 108 (90 Base) MCG/ACT inhaler Inhale into the lungs. 02/15/24  Yes [provider]  atorvastatin (LIPITOR) 20 MG tablet Take 20 mg by mouth daily.   Yes [provider]  busPIRone (BUSPAR) 30 MG tablet Take 30 mg by mouth 3 (three) times daily.   Yes [provider]  chlorthalidone (HYGROTON) 25 MG tablet Take 25 mg by mouth daily as needed. 12/15/23  Yes [provider]  ezetimibe (ZETIA) 10 MG tablet Take 10 mg by mouth every morning. 03/02/24  Yes [provider]   FLUoxetine (PROZAC) 40 MG capsule Take 60 mg by mouth daily.   Yes [provider]  gabapentin (NEURONTIN) 300 MG capsule Take 300 mg by mouth 2 (two) times daily.   Yes [provider]  glipiZIDE (GLUCOTROL) 10 MG tablet Take 10 mg by mouth 2 (two) times daily before a meal.   Yes [provider]  lamoTRIgine (LAMICTAL) 100 MG tablet Take 200 mg by mouth daily.   Yes [provider]  levothyroxine (SYNTHROID) 25 MCG tablet Take 25 mcg by mouth daily before breakfast.   Yes [provider]  lisinopril (ZESTRIL) 2.5 MG tablet Take 2.5 mg by mouth daily.   Yes [provider]  metFORMIN (GLUMETZA) 1000 MG (MOD) 24 hr tablet Take 1,000 mg by mouth 2 (two) times daily with a meal.   Yes [provider]  metoCLOPramide  (REGLAN ) 5 MG tablet Take 5 mg by mouth 2 (two) times daily as needed. 01/11/24  Yes [provider]  omega-3 acid ethyl esters (LOVAZA) 1 g capsule Take 2 capsules by mouth 2 (two) times daily. 03/07/24  Yes [provider]  omeprazole (PRILOSEC) 20 MG capsule Take 20 mg by mouth daily.   Yes [provider]  ondansetron  (ZOFRAN ) 4 MG tablet Take 1 tablet (4 mg total) by mouth every 8 (eight) hours as needed for nausea or vomiting. 03/09/24  Yes Gregg Winchell V, DO  TRADJENTA 5 MG TABS tablet Take  5 mg by mouth daily. 12/21/23  Yes [provider]  traZODone (DESYREL) 100 MG tablet Take 200 mg by mouth at bedtime. Patient taking differently: Take 50 mg by mouth at bedtime.   Yes [provider]  TRESIBA FLEXTOUCH 200 UNIT/ML FlexTouch Pen INJECT INTO THE SKIN 44 UNITS IN THE MORNING AND 40 UNITS IN THE EVENING 02/22/24  Yes [provider]  Continuous Glucose Sensor (FREESTYLE LIBRE 2 SENSOR) MISC USE AS DIRECTED FOR BLOOD SUGAR MONITORING AT LEAST THREE TIMES DAILY AND AS NEEDED SENSORS CHANGED EVERY 14 DAYS 01/19/24   [provider]  DROPLET PEN NEEDLES 31G X 8 MM  MISC SMARTSIG:pen needle Topical Daily 01/21/24   [provider]  lurasidone (LATUDA) 40 MG TABS tablet Take 40 mg by mouth daily with breakfast. Patient not taking: No sig reported    [provider]    Current Outpatient Medications  Medication Sig Dispense Refill   albuterol  (VENTOLIN  HFA) 108 (90 Base) MCG/ACT inhaler Inhale into the lungs.     atorvastatin (LIPITOR) 20 MG tablet Take 20 mg by mouth daily.     busPIRone (BUSPAR) 30 MG tablet Take 30 mg by mouth 3 (three) times daily.     chlorthalidone (HYGROTON) 25 MG tablet Take 25 mg by mouth daily as needed.     ezetimibe (ZETIA) 10 MG tablet Take 10 mg by mouth every morning.     FLUoxetine (PROZAC) 40 MG capsule Take 60 mg by mouth daily.     gabapentin (NEURONTIN) 300 MG capsule Take 300 mg by mouth 2 (two) times daily.     glipiZIDE (GLUCOTROL) 10 MG tablet Take 10 mg by mouth 2 (two) times daily before a meal.     lamoTRIgine (LAMICTAL) 100 MG tablet Take 200 mg by mouth daily.     levothyroxine (SYNTHROID) 25 MCG tablet Take 25 mcg by mouth daily before breakfast.     lisinopril (ZESTRIL) 2.5 MG tablet Take 2.5 mg by mouth daily.     metFORMIN (GLUMETZA) 1000 MG (MOD) 24 hr tablet Take 1,000 mg by mouth 2 (two) times daily with a meal.     metoCLOPramide  (REGLAN ) 5 MG tablet Take 5 mg by mouth 2 (two) times daily as needed.     omega-3 acid ethyl esters (LOVAZA) 1 g capsule Take 2 capsules by mouth 2 (two) times daily.     omeprazole (PRILOSEC) 20 MG capsule Take 20 mg by mouth daily.     ondansetron  (ZOFRAN ) 4 MG tablet Take 1 tablet (4 mg total) by mouth every 8 (eight) hours as needed for nausea or vomiting. 90 tablet 1   TRADJENTA 5 MG TABS tablet Take 5 mg by mouth daily.     traZODone (DESYREL) 100 MG tablet Take 200 mg by mouth at bedtime. (Patient taking differently: Take 50 mg by mouth at bedtime.)     TRESIBA FLEXTOUCH 200 UNIT/ML FlexTouch Pen INJECT INTO THE SKIN 44 UNITS IN THE MORNING AND 40 UNITS  IN THE EVENING     Continuous Glucose Sensor (FREESTYLE LIBRE 2 SENSOR) MISC USE AS DIRECTED FOR BLOOD SUGAR MONITORING AT LEAST THREE TIMES DAILY AND AS NEEDED SENSORS CHANGED EVERY 14 DAYS     DROPLET PEN NEEDLES 31G X 8 MM MISC SMARTSIG:pen needle Topical Daily     lurasidone (LATUDA) 40 MG TABS tablet Take 40 mg by mouth daily with breakfast. (Patient not taking: No sig reported)     Current Facility-Administered Medications  Medication Dose Route  Frequency Provider Last Rate Last Admin   0.9 %  sodium chloride infusion  500 mL Intravenous Continuous Cambree Hendrix V, DO        Allergies as of 03/21/2024   (No Known Allergies)    Family History  Problem Relation Age of Onset   Colon polyps Father    Stroke Father    Diabetes Father    Thyroid  cancer Maternal Grandmother    Colon polyps Paternal Grandfather    Colon cancer Neg Hx    Esophageal cancer Neg Hx    Stomach cancer Neg Hx    Rectal cancer Neg Hx     Social History   Socioeconomic History   Marital status: Married    Spouse name: Not on file   Number of children: Not on file   Years of education: Not on file   Highest education level: Not on file  Occupational History   Not on file  Tobacco Use   Smoking status: Every Day    Current packs/day: 1.00    Average packs/day: 1 pack/day for 25.8 years (25.8 ttl pk-yrs)    Types: Cigarettes    Start date: 2000   Smokeless tobacco: Never  Vaping Use   Vaping status: Every Day   Substances: CBD  Substance and Sexual Activity   Alcohol use: Never   Drug use: Yes    Frequency: 21.0 times per week    Types: Marijuana    Comment: Delta 8 vape, last smoked 03/20/24   Sexual activity: Not on file  Other Topics Concern   Not on file  Social History Narrative   Not on file   Social Drivers of Health   Financial Resource Strain: Not on file  Food Insecurity: Not on file  Transportation Needs: Not on file  Physical Activity: Not on file  Stress: Not on file   Social Connections: Not on file  Intimate Partner Violence: Not on file    Physical Exam: Vital signs in last 24 hours: @BP  127/78   Pulse 66   Temp 98.3 F (36.8 C)   Ht 5' 4 (1.626 m)   Wt 232 lb 6.4 oz (105.4 kg)   SpO2 96%   BMI 39.89 kg/m  GEN: NAD EYE: Sclerae anicteric ENT: MMM CV: Non-tachycardic Pulm: CTA b/l GI: Soft, NT/ND NEURO:  Alert & Oriented x 3   Sandor Flatter, DO [facility]   03/21/2024 11:26 AM

## 2024-03-21 NOTE — Progress Notes (Signed)
 Vss nad trans to pacu

## 2024-03-22 ENCOUNTER — Telehealth: Payer: Self-pay

## 2024-03-22 NOTE — Telephone Encounter (Signed)
  Follow up Call-     03/21/2024   10:21 AM  Call back number  Post procedure Call Back phone  # [phone]  Permission to leave phone message Yes     Patient questions:  Do you have a fever, pain , or abdominal swelling? No. Pain Score  0 *  Have you tolerated food without any problems? Yes.    Have you been able to return to your normal activities? Yes.    Do you have any questions about your discharge instructions: Diet   No. Medications  No. Follow up visit  No.  Do you have questions or concerns about your Care? No.  Actions: * If pain score is 4 or above: No action needed, pain <4.

## 2024-03-23 LAB — SURGICAL PATHOLOGY

## 2024-03-24 ENCOUNTER — Ambulatory Visit: Payer: Self-pay | Admitting: Gastroenterology
